# Patient Record
Sex: Male | Born: 1975
Health system: Southern US, Community
[De-identification: ages and names within clinical notes are randomized; demographics above are authoritative.]

## PROBLEM LIST (undated history)

## (undated) DIAGNOSIS — F410 Panic disorder [episodic paroxysmal anxiety] without agoraphobia: Secondary | ICD-10-CM

## (undated) DIAGNOSIS — M25519 Pain in unspecified shoulder: Secondary | ICD-10-CM

## (undated) DIAGNOSIS — F909 Attention-deficit hyperactivity disorder, unspecified type: Secondary | ICD-10-CM

## (undated) DIAGNOSIS — J449 Chronic obstructive pulmonary disease, unspecified: Secondary | ICD-10-CM

## (undated) DIAGNOSIS — F17200 Nicotine dependence, unspecified, uncomplicated: Secondary | ICD-10-CM

## (undated) DIAGNOSIS — F1120 Opioid dependence, uncomplicated: Secondary | ICD-10-CM

## (undated) DIAGNOSIS — F419 Anxiety disorder, unspecified: Secondary | ICD-10-CM

## (undated) DIAGNOSIS — G8929 Other chronic pain: Secondary | ICD-10-CM

## (undated) HISTORY — PX: FEMUR IM ROD REMOVAL: SHX1595

## (undated) HISTORY — DX: Panic disorder (episodic paroxysmal anxiety): F41.0

## (undated) HISTORY — PX: FEMUR FRACTURE SURGERY: SHX633

## (undated) HISTORY — DX: Attention-deficit hyperactivity disorder, unspecified type: F90.9

---

## 2015-12-17 ENCOUNTER — Emergency Department (HOSPITAL_BASED_OUTPATIENT_CLINIC_OR_DEPARTMENT_OTHER): Payer: Self-pay

## 2015-12-17 ENCOUNTER — Encounter (HOSPITAL_BASED_OUTPATIENT_CLINIC_OR_DEPARTMENT_OTHER): Payer: Self-pay | Admitting: *Deleted

## 2015-12-17 ENCOUNTER — Emergency Department (HOSPITAL_BASED_OUTPATIENT_CLINIC_OR_DEPARTMENT_OTHER)
Admission: EM | Admit: 2015-12-17 | Discharge: 2015-12-17 | Disposition: A | Discharge status: Home / Self Care | Payer: Self-pay | Attending: Emergency Medicine | Admitting: Emergency Medicine

## 2015-12-17 DIAGNOSIS — H9209 Otalgia, unspecified ear: Secondary | ICD-10-CM | POA: Insufficient documentation

## 2015-12-17 DIAGNOSIS — Z79899 Other long term (current) drug therapy: Secondary | ICD-10-CM | POA: Insufficient documentation

## 2015-12-17 DIAGNOSIS — J441 Chronic obstructive pulmonary disease with (acute) exacerbation: Secondary | ICD-10-CM | POA: Insufficient documentation

## 2015-12-17 DIAGNOSIS — Z7951 Long term (current) use of inhaled steroids: Secondary | ICD-10-CM | POA: Insufficient documentation

## 2015-12-17 DIAGNOSIS — R51 Headache: Secondary | ICD-10-CM | POA: Insufficient documentation

## 2015-12-17 DIAGNOSIS — R0981 Nasal congestion: Secondary | ICD-10-CM | POA: Insufficient documentation

## 2015-12-17 DIAGNOSIS — F17200 Nicotine dependence, unspecified, uncomplicated: Secondary | ICD-10-CM | POA: Insufficient documentation

## 2015-12-17 HISTORY — DX: Nicotine dependence, unspecified, uncomplicated: F17.200

## 2015-12-17 HISTORY — DX: Other chronic pain: G89.29

## 2015-12-17 HISTORY — DX: Anxiety disorder, unspecified: F41.9

## 2015-12-17 HISTORY — DX: Pain in unspecified shoulder: M25.519

## 2015-12-17 HISTORY — DX: Chronic obstructive pulmonary disease, unspecified: J44.9

## 2015-12-17 LAB — D-DIMER, QUANTITATIVE (NOT AT ARMC)

## 2015-12-17 LAB — CBC WITH DIFFERENTIAL/PLATELET
BASOS ABS: 0 10*3/uL (ref 0.0–0.1)
BASOS PCT: 0 %
EOS ABS: 0.1 10*3/uL (ref 0.0–0.7)
Eosinophils Relative: 2 %
HEMATOCRIT: 44.4 % (ref 39.0–52.0)
HEMOGLOBIN: 15.5 g/dL (ref 13.0–17.0)
Lymphocytes Relative: 29 %
Lymphs Abs: 1.9 10*3/uL (ref 0.7–4.0)
MCH: 31.8 pg (ref 26.0–34.0)
MCHC: 34.9 g/dL (ref 30.0–36.0)
MCV: 91 fL (ref 78.0–100.0)
Monocytes Absolute: 0.4 10*3/uL (ref 0.1–1.0)
Monocytes Relative: 6 %
NEUTROS ABS: 4.1 10*3/uL (ref 1.7–7.7)
NEUTROS PCT: 63 %
Platelets: 163 10*3/uL (ref 150–400)
RBC: 4.88 MIL/uL (ref 4.22–5.81)
RDW: 13 % (ref 11.5–15.5)
WBC: 6.6 10*3/uL (ref 4.0–10.5)

## 2015-12-17 LAB — BASIC METABOLIC PANEL
ANION GAP: 10 (ref 5–15)
BUN: 15 mg/dL (ref 6–20)
CALCIUM: 9.8 mg/dL (ref 8.9–10.3)
CHLORIDE: 102 mmol/L (ref 101–111)
CO2: 25 mmol/L (ref 22–32)
CREATININE: 0.71 mg/dL (ref 0.61–1.24)
GFR calc non Af Amer: >60 mL/min (ref >60)
Glucose, Bld: 101 mg/dL — ABNORMAL HIGH (ref 65–99)
Potassium: 4.5 mmol/L (ref 3.5–5.1)
SODIUM: 137 mmol/L (ref 135–145)

## 2015-12-17 LAB — TROPONIN I

## 2015-12-17 MED ORDER — DIPHENHYDRAMINE HCL 50 MG/ML IJ SOLN
25.0000 mg | Freq: Once | INTRAMUSCULAR | Status: AC
  Administered 2015-12-17: 25 mg via INTRAVENOUS
  Filled 2015-12-17: qty 1

## 2015-12-17 MED ORDER — METHYLPREDNISOLONE SODIUM SUCC 125 MG IJ SOLR
125.0000 mg | Freq: Once | INTRAMUSCULAR | Status: AC
  Administered 2015-12-17: 125 mg via INTRAVENOUS
  Filled 2015-12-17: qty 2

## 2015-12-17 MED ORDER — PREDNISONE 20 MG PO TABS: 60.0000 mg | ORAL_TABLET | Freq: Every day | ORAL | 0 refills | Status: AC

## 2015-12-17 MED ORDER — METOCLOPRAMIDE HCL 5 MG/ML IJ SOLN
10.0000 mg | Freq: Once | INTRAMUSCULAR | Status: AC
  Administered 2015-12-17: 10 mg via INTRAVENOUS
  Filled 2015-12-17: qty 2

## 2015-12-17 MED ORDER — KETOROLAC TROMETHAMINE 15 MG/ML IJ SOLN
15.0000 mg | Freq: Once | INTRAMUSCULAR | Status: AC
  Administered 2015-12-17: 15 mg via INTRAVENOUS
  Filled 2015-12-17: qty 1

## 2015-12-17 MED ORDER — ALBUTEROL SULFATE HFA 108 (90 BASE) MCG/ACT IN AERS: 1.0000 | INHALATION_SPRAY | RESPIRATORY_TRACT | 0 refills | Status: DC | PRN

## 2015-12-17 MED ORDER — DOXYCYCLINE HYCLATE 100 MG PO CAPS: 100.0000 mg | ORAL_CAPSULE | Freq: Two times a day (BID) | ORAL | 0 refills | Status: AC

## 2015-12-17 MED ORDER — NAPROXEN 375 MG PO TABS: 375.0000 mg | ORAL_TABLET | Freq: Two times a day (BID) | ORAL | 0 refills | Status: AC | PRN

## 2015-12-17 MED ORDER — ALBUTEROL SULFATE HFA 108 (90 BASE) MCG/ACT IN AERS
2.0000 | INHALATION_SPRAY | Freq: Once | RESPIRATORY_TRACT | Status: AC
  Administered 2015-12-17: 2 via RESPIRATORY_TRACT
  Filled 2015-12-17: qty 6.7

## 2015-12-17 NOTE — ED Triage Notes (Signed)
Pt c/o chest pain and congestion x 1 week with hx of copd

## 2015-12-17 NOTE — ED Provider Notes (Signed)
[facility]   CSN: [id number] Arrival date & time: 12/17/15  1429   By signing my name below, I, Teofilo PodMatthew P. Jamison, attest that this documentation has been prepared under the direction and in the presence of Shaune Pollackameron Kelsye Loomer, MD . Electronically Signed: Teofilo PodMatthew P. Jamison, ED Scribe. 12/17/2015. 3:37 PM.   History   Chief Complaint Chief Complaint  Patient presents with  . COPD    The history is provided by the patient. No language interpreter was used.   HPI Comments:  Dustin Steele is a 40 y.o. male with PMHx of COPD who presents to the Emergency Department complaining of constant chest pain x 1 week. Pt states that 1 week ago he [facility] and another shopper was spraying cans of spraypaint inside the store next to him and immediately began having symtpoms. Pt complains of associated congestion, headache, ear pain, and productive cough. Pt rates the chest pain at 5 or 6/10, and describes the pain as "constant discomfort" and "a dull stabbing." Pt states that the chest pain is exacerbated when taking deep breaths. Pt notes that the chest pain radiates back and forth across his chest. Pt is a smoker. Pt has taken cefuroxime with no relief. Pt denies fever, nausea, vomiting, rash.  Past Medical History:  Diagnosis Date  . Anxiety   . 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 COPD (chronic obstructive pulmonary disease) (HCC)   . Nicotine dependence     There are no active problems to display for this patient.   Past Surgical History:  Procedure Laterality Date  . FEMUR FRACTURE SURGERY    . FEMUR IM ROD REMOVAL         Home Medications    Prior to Admission medications   Medication Sig Start Date End Date Taking? Authorizing Provider  albuterol (PROVENTIL HFA;VENTOLIN HFA) 108 (90 Base) MCG/ACT inhaler Inhale into the lungs every 6 (six) hours as needed for wheezing or shortness of breath.   Yes Historical Provider, MD  ALPRAZolam  Prudy Feeler(XANAX) 0.25 MG tablet Take 0.25 mg by mouth at bedtime as needed for anxiety.   Yes Historical Provider, MD  fluticasone (FLONASE) 50 MCG/ACT nasal spray Place 2 sprays into both nostrils daily.   Yes Historical Provider, MD  Fluticasone-Salmeterol (ADVAIR) 250-50 MCG/DOSE AEPB Inhale 1 puff into the lungs 2 (two) times daily.   Yes Historical Provider, MD  tiotropium (SPIRIVA) 18 MCG inhalation capsule Place 18 mcg into inhaler and inhale daily.   Yes Historical Provider, MD  albuterol (PROVENTIL HFA;VENTOLIN HFA) 108 (90 Base) MCG/ACT inhaler Inhale 1-2 puffs into the lungs every 4 (four) hours as needed for wheezing or shortness of breath. 12/17/15   Shaune Pollackameron Enos Muhl, MD  doxycycline (VIBRAMYCIN) 100 MG capsule Take 1 capsule (100 mg total) by mouth 2 (two) times daily. 12/17/15 12/27/15  Shaune Pollackameron Harly Pipkins, MD  naproxen (NAPROSYN) 375 MG tablet Take 1 tablet (375 mg total) by mouth 2 (two) times daily as needed for moderate pain. 12/17/15 12/24/15  Shaune Pollackameron Worley Radermacher, MD  predniSONE (DELTASONE) 20 MG tablet Take 3 tablets (60 mg total) by mouth daily. 12/17/15 12/22/15  Shaune Pollackameron Renna Kilmer, MD    Family History History reviewed. No pertinent family history.  Social History Social History  Substance Use Topics  . Smoking status: Current Every Day Smoker    Packs/day: 1.00  . Smokeless tobacco: Not on file  . Alcohol use 1.2 oz/week    2 Cans of beer per week  Allergies   Review of patient's allergies indicates no known allergies.   Review of Systems Review of Systems  Constitutional: Negative for chills, fatigue and fever.  HENT: Positive for congestion and ear pain. Negative for rhinorrhea.   Eyes: Negative for visual disturbance.  Respiratory: Positive for cough. Negative for shortness of breath and wheezing.   Cardiovascular: Positive for chest pain. Negative for leg swelling.  Gastrointestinal: Negative for abdominal pain, diarrhea, nausea and vomiting.  Genitourinary: Negative for  dysuria and flank pain.  Musculoskeletal: Negative for neck pain and neck stiffness.  Skin: Negative for rash and wound.  Allergic/Immunologic: Negative for immunocompromised state.  Neurological: Positive for headaches. Negative for syncope and weakness.  All other systems reviewed and are negative.    Physical Exam Updated Vital Signs BP 115/75   Pulse (!) 55   Temp 98.1 F (36.7 C)   Resp 17   Ht 5\' 5"  (1.651 m)   Wt 135 lb (61.2 kg)   SpO2 98%   BMI 22.47 kg/m   Physical Exam  Constitutional: He is oriented to person, place, and time. He appears well-developed and well-nourished. No distress.  HENT:  Head: Normocephalic and atraumatic.  Eyes: Conjunctivae are normal.  Neck: Neck supple.  Cardiovascular: Normal rate, regular rhythm and normal heart sounds.  Exam reveals no friction rub.   No murmur heard. Pulmonary/Chest: Effort normal. No respiratory distress. He has wheezes (Mild, end expiratory only). He has no rales. He exhibits tenderness (Mild tenderness to palpation over anterior chest wall intercostal spaces. No bruising or deformity.).  Abdominal: He exhibits no distension.  Musculoskeletal: He exhibits no edema.  No calf pain, tenderness, or swelling  Neurological: He is alert and oriented to person, place, and time. He has normal strength. He is not disoriented. No cranial nerve deficit or sensory deficit. He exhibits normal muscle tone. GCS eye subscore is 4. GCS verbal subscore is 5. GCS motor subscore is 6.  Skin: Skin is warm. Capillary refill takes less than 2 seconds.  Psychiatric: He has a normal mood and affect.  Nursing note and vitals reviewed.    ED Treatments / Results  DIAGNOSTIC STUDIES:  Oxygen Saturation is 100% on RA, normal by my interpretation.    COORDINATION OF CARE:  3:37 PM Discussed treatment plan with pt at bedside and pt agreed to plan.   Labs (all labs ordered are listed, but only abnormal results are displayed) Labs  Reviewed  BASIC METABOLIC PANEL - Abnormal; Notable for the following:       Result Value   Glucose, Bld 101 (*)    All other components within normal limits  CBC WITH DIFFERENTIAL/PLATELET  D-DIMER, QUANTITATIVE (NOT AT [facility])  TROPONIN I    EKG  EKG Interpretation  Date/Time:  Sunday December 17 2015 14:39:36 EDT Ventricular Rate:  71 PR Interval:  140 QRS Duration: 102 QT Interval:  394 QTC Calculation: 428 R Axis:   86 Text Interpretation:  Normal sinus rhythm No old tracing to compare Confirmed by Rayman Petrosian MD, Semone Orlov [phone]([id number]) on 12/17/2015 3:27:03 PM       Radiology Dg Chest 2 View  Result Date: 12/17/2015 CLINICAL DATA:  Bilateral chest wall pain for the past week at care in handling pain being sprayed. History of COPD, chronic bronchitis and emphysema. EXAM: CHEST  2 VIEW COMPARISON:  None. FINDINGS: Normal sized heart. Clear lungs. The lungs are hyperexpanded. Unremarkable bones. IMPRESSION: No acute abnormality.  COPD. Electronically Signed   By: Beckie SaltsSteven  Reid  M.D.   On: 12/17/2015 15:23    Procedures Procedures (including critical care time)  Medications Ordered in ED Medications  methylPREDNISolone sodium succinate (SOLU-MEDROL) 125 mg/2 mL injection 125 mg (125 mg Intravenous Given 12/17/15 1611)  albuterol (PROVENTIL HFA;VENTOLIN HFA) 108 (90 Base) MCG/ACT inhaler 2 puff (2 puffs Inhalation Given 12/17/15 1549)  metoCLOPramide (REGLAN) injection 10 mg (10 mg Intravenous Given 12/17/15 1613)  diphenhydrAMINE (BENADRYL) injection 25 mg (25 mg Intravenous Given 12/17/15 1611)  ketorolac (TORADOL) 15 MG/ML injection 15 mg (15 mg Intravenous Given 12/17/15 1710)     Initial Impression / Assessment and Plan / ED Course  I have reviewed the triage vital signs and the nursing notes.  Pertinent labs & imaging results that were available during my care of the patient were reviewed by me and considered in my medical decision making (see chart for details).  Clinical  Course    40 year old male with past medical history of COPD here with cough, sputum production, and shortness of breath as well as positional, reproducible chest pain in the setting of known pain exposure, which has triggered his COPD in the past. On arrival, vital signs are stable. He is satting well on room air. No tachycardia. My primary suspicion is reactive airway COPD exacerbation in the setting of known trigger. Patient also may have URI versus reactive sinusitis in the setting of allergen exposure. Will treat with steroids, breathing treatment, and reassess. Otherwise, patient has multiple chronic complaints. Regarding his chest pain, his EKG is nonischemic. It is sharp, positional, likely secondary to coughing. However, given pleuritic component, will obtain d-dimer. Patient is low to moderate risk well's score and I believe this is sufficient for evaluation if negative. Otherwise, doubt ACS but will check screening troponin. Single troponin will be sufficient setting of constant, unchanging pain 24 hours. Regarding his headache, he has chronic migraines. Her headache is similar to his usual migraine. No focal numbness, weakness, or neurological deficits to suggest CVA or intracranial lesion. Will give migraine cocktail.  Chest x-ray is clear without pneumonia. CBC shows no leukocytosis. BMP is unremarkable. Troponin is negative, which is reassuring in setting of constant, unchanged chest pain. Suspect musculoskeletal chest wall pain. D-dimer is pending. Symptoms improved with breathing treatment and migraine cocktail.  D-dimer is negative. Headache is resolved. Suspect musculoskeletal chest pain in the setting of COPD exacerbation. Will treat with prednisone, doxycycline, and outpatient follow-up.   Final Clinical Impressions(s) / ED Diagnoses   Final diagnoses:  COPD exacerbation (HCC)    New Prescriptions Discharge Medication List as of 12/17/2015  5:26 PM    START taking these  medications   Details  !! albuterol (PROVENTIL HFA;VENTOLIN HFA) 108 (90 Base) MCG/ACT inhaler Inhale 1-2 puffs into the lungs every 4 (four) hours as needed for wheezing or shortness of breath., Starting Sun 12/17/2015, Print    doxycycline (VIBRAMYCIN) 100 MG capsule Take 1 capsule (100 mg total) by mouth 2 (two) times daily., Starting Sun 12/17/2015, Until Wed 12/27/2015, Print    predniSONE (DELTASONE) 20 MG tablet Take 3 tablets (60 mg total) by mouth daily., Starting Sun 12/17/2015, Until Fri 12/22/2015, Print     !! - Potential duplicate medications found. Please discuss with provider.      I personally performed the services described in this documentation, which was scribed in my presence. The recorded information has been reviewed and is accurate.     Shaune Pollack, MD 12/18/15 [phone]

## 2016-02-20 ENCOUNTER — Emergency Department (HOSPITAL_BASED_OUTPATIENT_CLINIC_OR_DEPARTMENT_OTHER): Payer: Self-pay

## 2016-02-20 ENCOUNTER — Emergency Department (HOSPITAL_BASED_OUTPATIENT_CLINIC_OR_DEPARTMENT_OTHER)
Admission: EM | Admit: 2016-02-20 | Discharge: 2016-02-20 | Disposition: A | Discharge status: Home / Self Care | Payer: Self-pay | Attending: Physician Assistant | Admitting: Physician Assistant

## 2016-02-20 ENCOUNTER — Encounter (HOSPITAL_BASED_OUTPATIENT_CLINIC_OR_DEPARTMENT_OTHER): Payer: Self-pay | Admitting: *Deleted

## 2016-02-20 DIAGNOSIS — F17200 Nicotine dependence, unspecified, uncomplicated: Secondary | ICD-10-CM | POA: Insufficient documentation

## 2016-02-20 DIAGNOSIS — J42 Unspecified chronic bronchitis: Secondary | ICD-10-CM | POA: Insufficient documentation

## 2016-02-20 DIAGNOSIS — Z79899 Other long term (current) drug therapy: Secondary | ICD-10-CM | POA: Insufficient documentation

## 2016-02-20 DIAGNOSIS — R0602 Shortness of breath: Secondary | ICD-10-CM | POA: Insufficient documentation

## 2016-02-20 LAB — CBC WITH DIFFERENTIAL/PLATELET
Basophils Absolute: 0 10*3/uL (ref 0.0–0.1)
Basophils Relative: 1 %
Eosinophils Absolute: 0.1 10*3/uL (ref 0.0–0.7)
Eosinophils Relative: 1 %
HEMATOCRIT: 40.8 % (ref 39.0–52.0)
Hemoglobin: 14 g/dL (ref 13.0–17.0)
LYMPHS ABS: 2.1 10*3/uL (ref 0.7–4.0)
LYMPHS PCT: 37 %
MCH: 32.3 pg (ref 26.0–34.0)
MCHC: 34.3 g/dL (ref 30.0–36.0)
MCV: 94 fL (ref 78.0–100.0)
MONO ABS: 0.4 10*3/uL (ref 0.1–1.0)
MONOS PCT: 7 %
NEUTROS ABS: 3.1 10*3/uL (ref 1.7–7.7)
Neutrophils Relative %: 54 %
Platelets: 187 10*3/uL (ref 150–400)
RBC: 4.34 MIL/uL (ref 4.22–5.81)
RDW: 13.1 % (ref 11.5–15.5)
WBC: 5.7 10*3/uL (ref 4.0–10.5)

## 2016-02-20 LAB — D-DIMER, QUANTITATIVE (NOT AT ARMC)

## 2016-02-20 LAB — BRAIN NATRIURETIC PEPTIDE: B Natriuretic Peptide: 45.3 pg/mL (ref 0.0–100.0)

## 2016-02-20 LAB — TROPONIN I: Troponin I: <0.03 ng/mL (ref <0.03)

## 2016-02-20 MED ORDER — BENZONATATE 100 MG PO CAPS: 100.0000 mg | ORAL_CAPSULE | Freq: Three times a day (TID) | ORAL | 0 refills | Status: DC

## 2016-02-20 MED ORDER — IPRATROPIUM-ALBUTEROL 0.5-2.5 (3) MG/3ML IN SOLN
3.0000 mL | Freq: Once | RESPIRATORY_TRACT | Status: AC
  Administered 2016-02-20: 3 mL via RESPIRATORY_TRACT
  Filled 2016-02-20: qty 3

## 2016-02-20 MED ORDER — PREDNISONE 50 MG PO TABS
60.0000 mg | ORAL_TABLET | Freq: Once | ORAL | Status: AC
  Administered 2016-02-20: 60 mg via ORAL
  Filled 2016-02-20: qty 1

## 2016-02-20 MED ORDER — AZITHROMYCIN 250 MG PO TABS: 500.0000 mg | ORAL_TABLET | Freq: Every day | ORAL | 0 refills | Status: DC

## 2016-02-20 MED ORDER — BENZONATATE 100 MG PO CAPS
100.0000 mg | ORAL_CAPSULE | Freq: Once | ORAL | Status: AC
  Administered 2016-02-20: 100 mg via ORAL
  Filled 2016-02-20: qty 1

## 2016-02-20 MED ORDER — PREDNISONE 20 MG PO TABS: 40.0000 mg | ORAL_TABLET | Freq: Every day | ORAL | 0 refills | Status: DC

## 2016-02-20 NOTE — Discharge Instructions (Signed)
Take the tessalon for cough. Take the prednisone for 3 days. Take the antibiotic once a day for 3 days. Use your albuterol inhaler as needed. Return to the ED if your symptoms worsen. Follow up with a primary care doctor as needed. Take tylenol and motrin as needed for pain.

## 2016-02-20 NOTE — ED Provider Notes (Signed)
[facility]   CSN: [id number] Arrival date & time: 02/20/16  0865  By signing my name below, I, Dustin Steele, attest that this documentation has been prepared under the direction and in the presence of Demetrios Loll, PA-C. Electronically Signed: Linna Steele, Scribe. 02/20/2016. 7:53 PM.  History   Chief Complaint Chief Complaint  Patient presents with  . Cough    The history is provided by the patient. No language interpreter was used.     HPI Comments: Dustin Steele is a 41 y.o. male with PMHx significant for COPD and chronic bronchitis who presents to the Emergency Department complaining of a persistent dry cough for about 3 days. He reports associated burning chest pain secondary to his cough as well as sinus pressure, congestion, and subjective fever/chills. He has tried Sudafed and TheraFlu with no relief of his symptoms. He also has an inhaler at home which he has used with some transient improvement of his cough. He notes he has experienced the same symptoms several times in the past and describes these episodes as exacerbations of his COPD and chronic bronchitis. He was seen here ~ 3 months ago for the same and was prescribed prednisone and doxycycline with good relief of his symptoms. He has no known sick contacts with similar symptoms. Pt is a regular smoker. He denies rhinorrhea, body aches, or any other associated symptoms.  Past Medical History:  Diagnosis Date  . Anxiety   . 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 COPD (chronic obstructive pulmonary disease) (HCC)   . Nicotine dependence     There are no active problems to display for this patient.   Past Surgical History:  Procedure Laterality Date  . FEMUR FRACTURE SURGERY    . FEMUR IM ROD REMOVAL         Home Medications    Prior to Admission medications   Medication Sig Start Date End Date Taking? Authorizing Provider  albuterol (PROVENTIL HFA;VENTOLIN HFA) 108 (90 Base) MCG/ACT  inhaler Inhale into the lungs every 6 (six) hours as needed for wheezing or shortness of breath.   Yes Historical Provider, MD  ALPRAZolam Prudy Feeler) 0.25 MG tablet Take 0.25 mg by mouth at bedtime as needed for anxiety.   Yes Historical Provider, MD  fluticasone (FLONASE) 50 MCG/ACT nasal spray Place 2 sprays into both nostrils daily.   Yes Historical Provider, MD  Fluticasone-Salmeterol (ADVAIR) 250-50 MCG/DOSE AEPB Inhale 1 puff into the lungs 2 (two) times daily.   Yes Historical Provider, MD  tiotropium (SPIRIVA) 18 MCG inhalation capsule Place 18 mcg into inhaler and inhale daily.   Yes Historical Provider, MD  albuterol (PROVENTIL HFA;VENTOLIN HFA) 108 (90 Base) MCG/ACT inhaler Inhale 1-2 puffs into the lungs every 4 (four) hours as needed for wheezing or shortness of breath. 12/17/15   Shaune Pollack, MD    Family History No family history on file.  Social History Social History  Substance Use Topics  . Smoking status: Current Every Day Smoker    Packs/day: 1.00  . Smokeless tobacco: Never Used  . Alcohol use 1.2 oz/week    2 Cans of beer per week     Allergies   Patient has no known allergies.   Review of Systems Review of Systems  Constitutional: Positive for chills (subjective) and fever (subjective).  HENT: Positive for congestion and sinus pressure. Negative for rhinorrhea.   Respiratory: Positive for cough.   Cardiovascular: Positive for chest pain (secondary to cough).  Musculoskeletal: Negative  for myalgias.  All other systems reviewed and are negative.    Physical Exam Updated Vital Signs BP 156/91 (BP Location: Left Arm)   Pulse 83   Temp 97.8 F (36.6 C) (Oral)   Resp 18   Ht 5\' 5"  (1.651 m)   Wt 142 lb (64.4 kg)   SpO2 100%   BMI 23.63 kg/m   Physical Exam  Constitutional: He is oriented to person, place, and time. He appears well-developed and well-nourished. No distress.  HENT:  Head: Normocephalic and atraumatic.  Right Ear: Tympanic membrane,  external ear and ear canal normal.  Left Ear: Tympanic membrane, external ear and ear canal normal.  Nose: Mucosal edema and rhinorrhea present. Right sinus exhibits no maxillary sinus tenderness and no frontal sinus tenderness. Left sinus exhibits no maxillary sinus tenderness and no frontal sinus tenderness.  Mouth/Throat: Mucous membranes are normal. Posterior oropharyngeal erythema present. No oropharyngeal exudate, posterior oropharyngeal edema or tonsillar abscesses. Tonsils are 1+ on the right. Tonsils are 1+ on the left. No tonsillar exudate.  Eyes: Conjunctivae and EOM are normal. Pupils are equal, round, and reactive to light.  Neck: Neck supple. No tracheal deviation present. Thyromegaly present.  Cardiovascular: Normal rate, regular rhythm, normal heart sounds and intact distal pulses.  Exam reveals no gallop and no friction rub.   No murmur heard. Pulmonary/Chest: Effort normal. No accessory muscle usage. No tachypnea. No respiratory distress. He has no decreased breath sounds. He has wheezes (throughout).  Abdominal: Soft. Bowel sounds are normal. He exhibits no distension. There is no tenderness. There is no rebound and no guarding.  Musculoskeletal: Normal range of motion.  Neurological: He is alert and oriented to person, place, and time.  Skin: Skin is warm and dry. Capillary refill takes less than 2 seconds.  Psychiatric: He has a normal mood and affect. His behavior is normal.  Nursing note and vitals reviewed.    ED Treatments / Results  Labs (all labs ordered are listed, but only abnormal results are displayed) Labs Reviewed  D-DIMER, QUANTITATIVE (NOT AT [facility])  TROPONIN I  CBC WITH DIFFERENTIAL/PLATELET  BRAIN NATRIURETIC PEPTIDE    EKG  EKG Interpretation  Date/Time:  Tuesday February 20 2016 20:36:40 EST Ventricular Rate:  93 PR Interval:  126 QRS Duration: 100 QT Interval:  372 QTC Calculation: 462 R Axis:   91 Text Interpretation:  Normal sinus rhythm  Rightward axis Left ventricular hypertrophy Abnormal ECG No significant change since last tracing Confirmed by Kandis Mannan ([id number]) on 02/20/2016 10:13:26 PM       Radiology Dg Chest 2 View  Result Date: 02/20/2016 CLINICAL DATA:  Cough and chest congestion. EXAM: CHEST  2 VIEW COMPARISON:  12/17/2015 FINDINGS: Normal heart size and mediastinal contours. Mild hyperinflation in this patient with history of COPD. No acute infiltrate or edema. No effusion or pneumothorax. No acute osseous findings. IMPRESSION: No acute finding.  Negative for pneumonia. Electronically Signed   By: Marnee Spring M.D.   On: 02/20/2016 16:36    Procedures Procedures (including critical care time)  DIAGNOSTIC STUDIES: Oxygen Saturation is 100% on RA, normal by my interpretation.    COORDINATION OF CARE: 7:59 PM Discussed treatment plan with pt at bedside and pt agreed to plan.  Medications Ordered in ED Medications  ipratropium-albuterol (DUONEB) 0.5-2.5 (3) MG/3ML nebulizer solution 3 mL (3 mLs Nebulization Given 02/20/16 2015)  predniSONE (DELTASONE) tablet 60 mg (60 mg Oral Given 02/20/16 2040)  benzonatate (TESSALON) capsule 100 mg (100 mg Oral Given  02/20/16 2040)     Initial Impression / Assessment and Plan / ED Course  I have reviewed the triage vital signs and the nursing notes.  Pertinent labs & imaging results that were available during my care of the patient were reviewed by me and considered in my medical decision making (see chart for details).  Clinical Course   Patient presents with productive cough, sob, chest pain. PMh includes COPD. Patient is afebrile, no hypoxia or tachypnea. Lab unremarkable. EKG with no acute changes. D dimer negative. Patient is low to moderate risk well's score. Negative troponin. Single troponin will be sufficient setting of constant, unchanging pain 24 hours. CXR shows no signs of infiltrate. CBC shows no leukocytosis. BMP normal. Pt given steroids, duoneb, and  cough medicine. Sig improvement in symptoms. Likely COPD exacerbation in setting of URI. Given steroids, albuterol, tessalon and azithromycin. Pt is hemodynamically stable and in NAD. Pt is not hypoxic or tacnhpenic. Pt is hemodynamically stable, in NAD, & able to ambulate in the ED. Pain has been managed & has no complaints prior to dc. Pt is comfortable with above plan and is stable for discharge at this time. All questions were answered prior to disposition. Strict return precautions for f/u to the ED were discussed.       Final Clinical Impressions(s) / ED Diagnoses   Final diagnoses:  Chronic bronchitis, unspecified chronic bronchitis type [facility](HCC)    New Prescriptions Discharge Medication List as of 02/20/2016 10:25 PM    START taking these medications   Details  azithromycin (ZITHROMAX) 250 MG tablet Take 2 tablets (500 mg total) by mouth daily. Take first 2 tablets together, then 1 every day until finished., Starting Tue 02/20/2016, Print    benzonatate (TESSALON) 100 MG capsule Take 1 capsule (100 mg total) by mouth every 8 (eight) hours., Starting Tue 02/20/2016, Print    predniSONE (DELTASONE) 20 MG tablet Take 2 tablets (40 mg total) by mouth daily with breakfast., Starting Tue 02/20/2016, Print      I personally performed the services described in this documentation, which was scribed in my presence. The recorded information has been reviewed and is accurate.    Rise MuKenneth T Leaphart, PA-C 02/25/16 0139    Courteney Randall AnLyn Mackuen, MD 02/26/16 [id number]

## 2016-02-20 NOTE — ED Notes (Signed)
Pt. Asking about a work note.  Explained that the Dr. Erasmo Downeridn't write one.  Pt. Must be clear to go back to work.

## 2016-02-25 ENCOUNTER — Emergency Department (HOSPITAL_BASED_OUTPATIENT_CLINIC_OR_DEPARTMENT_OTHER): Payer: Self-pay

## 2016-02-25 ENCOUNTER — Encounter (HOSPITAL_BASED_OUTPATIENT_CLINIC_OR_DEPARTMENT_OTHER): Payer: Self-pay | Admitting: *Deleted

## 2016-02-25 ENCOUNTER — Emergency Department (HOSPITAL_BASED_OUTPATIENT_CLINIC_OR_DEPARTMENT_OTHER)
Admission: EM | Admit: 2016-02-25 | Discharge: 2016-02-25 | Disposition: A | Discharge status: Home / Self Care | Payer: Self-pay | Attending: Emergency Medicine | Admitting: Emergency Medicine

## 2016-02-25 DIAGNOSIS — F17200 Nicotine dependence, unspecified, uncomplicated: Secondary | ICD-10-CM | POA: Insufficient documentation

## 2016-02-25 DIAGNOSIS — J441 Chronic obstructive pulmonary disease with (acute) exacerbation: Secondary | ICD-10-CM | POA: Insufficient documentation

## 2016-02-25 DIAGNOSIS — Z79899 Other long term (current) drug therapy: Secondary | ICD-10-CM | POA: Insufficient documentation

## 2016-02-25 DIAGNOSIS — J069 Acute upper respiratory infection, unspecified: Secondary | ICD-10-CM | POA: Insufficient documentation

## 2016-02-25 DIAGNOSIS — B9789 Other viral agents as the cause of diseases classified elsewhere: Secondary | ICD-10-CM

## 2016-02-25 MED ORDER — IPRATROPIUM BROMIDE 0.02 % IN SOLN
0.5000 mg | Freq: Once | RESPIRATORY_TRACT | Status: AC
  Administered 2016-02-25: 0.5 mg via RESPIRATORY_TRACT
  Filled 2016-02-25: qty 2.5

## 2016-02-25 MED ORDER — ALBUTEROL SULFATE (2.5 MG/3ML) 0.083% IN NEBU
5.0000 mg | INHALATION_SOLUTION | Freq: Once | RESPIRATORY_TRACT | Status: AC
  Administered 2016-02-25: 5 mg via RESPIRATORY_TRACT
  Filled 2016-02-25: qty 6

## 2016-02-25 MED ORDER — PREDNISONE 20 MG PO TABS: ORAL_TABLET | ORAL | 0 refills | Status: DC

## 2016-02-25 MED ORDER — BENZONATATE 100 MG PO CAPS
100.0000 mg | ORAL_CAPSULE | Freq: Once | ORAL | Status: AC
  Administered 2016-02-25: 100 mg via ORAL
  Filled 2016-02-25: qty 1

## 2016-02-25 MED ORDER — BENZONATATE 100 MG PO CAPS: 100.0000 mg | ORAL_CAPSULE | Freq: Three times a day (TID) | ORAL | 0 refills | Status: DC

## 2016-02-25 NOTE — ED Notes (Signed)
Patient transported to X-ray 

## 2016-02-25 NOTE — ED Provider Notes (Signed)
[facility] Provider Note   CSN: [id number] Arrival date & time: 02/25/16  1048     History   Chief Complaint Chief Complaint  Patient presents with  . Cough    HPI Dustin Steele is a 41 y.o. male.  HPI   41 year old male with history of anxiety, COPD, chronic shoulder pain and nicotine dependence presenting complaining of cold symptoms. Patient was seen 5 days ago for evaluation of cold symptoms. He was discharged with a course of steroid, Z-Pak and symptomatic treatment. He reported his by using the medication he continues to have shortness of breath, nonproductive cough, occasional wheezing. He denies any fever, chills, hemoptysis, or rash. Patient states in the past when he has failed like this, he usually gets a longer duration of steroid course as well as doxycycline. He denies any recent travel, prior history of PE or DVT, calf pain or leg swelling. He smoker. No nausea vomiting diarrhea.      Past Medical History:  Diagnosis Date  . Anxiety   . 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 COPD (chronic obstructive pulmonary disease) (HCC)   . Nicotine dependence     There are no active problems to display for this patient.   Past Surgical History:  Procedure Laterality Date  . FEMUR FRACTURE SURGERY    . FEMUR IM ROD REMOVAL         Home Medications    Prior to Admission medications   Medication Sig Start Date End Date Taking? Authorizing Provider  albuterol (PROVENTIL HFA;VENTOLIN HFA) 108 (90 Base) MCG/ACT inhaler Inhale 1-2 puffs into the lungs every 4 (four) hours as needed for wheezing or shortness of breath. 12/17/15  Yes Shaune Pollack, MD  ALPRAZolam Prudy Feeler) 0.25 MG tablet Take 0.25 mg by mouth at bedtime as needed for anxiety.   Yes Historical Provider, MD  fluticasone (FLONASE) 50 MCG/ACT nasal spray Place 2 sprays into both nostrils daily.   Yes Historical Provider, MD  Fluticasone-Salmeterol (ADVAIR) 250-50 MCG/DOSE AEPB Inhale 1 puff into the lungs  2 (two) times daily.   Yes Historical Provider, MD  albuterol (PROVENTIL HFA;VENTOLIN HFA) 108 (90 Base) MCG/ACT inhaler Inhale into the lungs every 6 (six) hours as needed for wheezing or shortness of breath.    Historical Provider, MD  azithromycin (ZITHROMAX) 250 MG tablet Take 2 tablets (500 mg total) by mouth daily. Take first 2 tablets together, then 1 every day until finished. 02/20/16   Rise Mu, PA-C  benzonatate (TESSALON) 100 MG capsule Take 1 capsule (100 mg total) by mouth every 8 (eight) hours. 02/20/16   Rise Mu, PA-C  predniSONE (DELTASONE) 20 MG tablet Take 2 tablets (40 mg total) by mouth daily with breakfast. 02/20/16   Rise Mu, PA-C  tiotropium (SPIRIVA) 18 MCG inhalation capsule Place 18 mcg into inhaler and inhale daily.    Historical Provider, MD    Family History No family history on file.  Social History Social History  Substance Use Topics  . Smoking status: Current Every Day Smoker    Packs/day: 1.00  . Smokeless tobacco: Never Used  . Alcohol use 1.2 oz/week    2 Cans of beer per week     Allergies   Patient has no known allergies.   Review of Systems Review of Systems  All other systems reviewed and are negative.    Physical Exam Updated Vital Signs BP 149/83 (BP Location: Right Arm)   Pulse 84   Temp 98.2  F (36.8 C) (Oral)   Resp 18   Ht 5\' 5"  (1.651 m)   Wt 64.4 kg   SpO2 98%   BMI 23.63 kg/m   Physical Exam  Constitutional: He appears well-developed and well-nourished. No distress.  HENT:  Head: Atraumatic.  Right Ear: External ear normal.  Left Ear: External ear normal.  Nose: Nose normal.  Mouth/Throat: Oropharynx is clear and moist.  Eyes: Conjunctivae are normal.  Neck: Normal range of motion. Neck supple.  Cardiovascular: Normal rate and regular rhythm.   Pulmonary/Chest: Effort normal. He has wheezes (Faint expiratory wheezes).  Abdominal: Soft. There is no tenderness.  Musculoskeletal: Normal  range of motion. He exhibits no edema.  Neurological: He is alert.  Skin: No rash noted.  Psychiatric: He has a normal mood and affect.  Nursing note and vitals reviewed.    ED Treatments / Results  Labs (all labs ordered are listed, but only abnormal results are displayed) Labs Reviewed - No data to display  EKG  EKG Interpretation None       Radiology Dg Chest 2 View  Result Date: 02/25/2016 CLINICAL DATA:  Chest congestion and cough EXAM: CHEST  2 VIEW COMPARISON:  02/20/2016 FINDINGS: The heart size and mediastinal contours are within normal limits. Both lungs are clear. The visualized skeletal structures are unremarkable. Left nipple piercing again noted. IMPRESSION: No active cardiopulmonary disease. Electronically Signed   By: Judie PetitM.  Shick M.D.   On: 02/25/2016 11:48    Procedures Procedures (including critical care time)  Medications Ordered in ED Medications - No data to display   Initial Impression / Assessment and Plan / ED Course  I have reviewed the triage vital signs and the nursing notes.  Pertinent labs & imaging results that were available during my care of the patient were reviewed by me and considered in my medical decision making (see chart for details).  Clinical Course    BP 127/77 (BP Location: Right Arm)   Pulse 95   Temp 98.2 F (36.8 C) (Oral)   Resp 18   Ht 5\' 5"  (1.651 m)   Wt 64.4 kg   SpO2 100%   BMI 23.63 kg/m    Final Clinical Impressions(s) / ED Diagnoses   Final diagnoses:  Viral URI with cough  COPD exacerbation (HCC)    New Prescriptions New Prescriptions   BENZONATATE (TESSALON) 100 MG CAPSULE    Take 1 capsule (100 mg total) by mouth every 8 (eight) hours.   PREDNISONE (DELTASONE) 20 MG TABLET    3 tabs po day one, then 2 tabs daily x 4 days    1:19 PM Patient here with a prolonged course of viral URI worsening his COPD. He was treated recently with antibiotic and steroid but symptoms still persist. He is  well-appearing on exam, in no acute respiratory distress, faint expiratory wheezes only. Repeat chest x-ray without any signs of pneumonia. Plan to provide breathing treatment here, and will prescribe a short course of steroid. I do not think additional antibiotic is indicated at this time.   Fayrene HelperBowie Juriel Cid, PA-C 02/25/16 1349    Lyndal Pulleyaniel Knott, MD 02/25/16 [phone]

## 2016-02-25 NOTE — ED Notes (Signed)
Pt upset about having to wait so long for a breathing tx. I explained to him that the provider had not ordered one and that I would go ask him about it.

## 2016-02-25 NOTE — ED Notes (Signed)
Pt complained of hyperventilating during breathing tx. tx was stopped and pt was instructed to take so deep breaths, heart rate decreased. RN at bedside.

## 2016-02-25 NOTE — ED Triage Notes (Signed)
Patient states he was seen here 5 days ago for non productive cough, chest congestion and pain in the right chest with coughing.  States he was given steroids and antibiotics and feels like his symptoms are unchanged.  History of COPD and current smoker.

## 2016-02-25 NOTE — ED Notes (Signed)
Pulse pre walk 92, sats 100%, during and post walk sats remain 100% hr 95. Tolerated well.

## 2016-04-06 ENCOUNTER — Emergency Department (HOSPITAL_BASED_OUTPATIENT_CLINIC_OR_DEPARTMENT_OTHER)
Admission: EM | Admit: 2016-04-06 | Discharge: 2016-04-06 | Disposition: A | Discharge status: Home / Self Care | Payer: Self-pay | Attending: Physician Assistant | Admitting: Physician Assistant

## 2016-04-06 ENCOUNTER — Emergency Department (HOSPITAL_BASED_OUTPATIENT_CLINIC_OR_DEPARTMENT_OTHER): Payer: Self-pay

## 2016-04-06 DIAGNOSIS — Y999 Unspecified external cause status: Secondary | ICD-10-CM | POA: Insufficient documentation

## 2016-04-06 DIAGNOSIS — F17200 Nicotine dependence, unspecified, uncomplicated: Secondary | ICD-10-CM | POA: Insufficient documentation

## 2016-04-06 DIAGNOSIS — W228XXA Striking against or struck by other objects, initial encounter: Secondary | ICD-10-CM | POA: Insufficient documentation

## 2016-04-06 DIAGNOSIS — Y929 Unspecified place or not applicable: Secondary | ICD-10-CM | POA: Insufficient documentation

## 2016-04-06 DIAGNOSIS — Y939 Activity, unspecified: Secondary | ICD-10-CM | POA: Insufficient documentation

## 2016-04-06 DIAGNOSIS — Z79899 Other long term (current) drug therapy: Secondary | ICD-10-CM | POA: Insufficient documentation

## 2016-04-06 DIAGNOSIS — J449 Chronic obstructive pulmonary disease, unspecified: Secondary | ICD-10-CM | POA: Insufficient documentation

## 2016-04-06 DIAGNOSIS — R0781 Pleurodynia: Secondary | ICD-10-CM | POA: Insufficient documentation

## 2016-04-06 MED ORDER — OXYCODONE-ACETAMINOPHEN 5-325 MG PO TABS: 1.0000 | ORAL_TABLET | Freq: Four times a day (QID) | ORAL | 0 refills | Status: DC | PRN

## 2016-04-06 MED ORDER — OXYCODONE-ACETAMINOPHEN 5-325 MG PO TABS
1.0000 | ORAL_TABLET | Freq: Once | ORAL | Status: AC
  Administered 2016-04-06: 1 via ORAL
  Filled 2016-04-06: qty 1

## 2016-04-06 NOTE — ED Notes (Signed)
ED Provider at bedside. 

## 2016-04-06 NOTE — Discharge Instructions (Signed)
Please be wary of any signs of pneumonia and return. You'll need to return to her primary care office if you need any other pain medications.

## 2016-04-06 NOTE — ED Notes (Signed)
Patient transported to X-ray 

## 2016-04-06 NOTE — ED Provider Notes (Signed)
[facility]   CSN: [id number] Arrival date & time: 04/06/16  1902  By signing my name below, I, Dustin Steele, attest that this documentation has been prepared under the direction and in the presence of Dustin Kleckner Randall An, MD. Electronically Signed: Modena Steele, Scribe. 04/06/2016. 7:46 PM.  History   Chief Complaint Chief Complaint  Patient presents with  . Rib Injury   The history is provided by the patient. No language interpreter was used.   HPI Comments: Dustin Steele is a 41 y.o. male who presents to the Emergency Department complaining of constant moderate posterior left rib pain that started 2 days ago. He states he backed up into a trailer and hit his rib. He took tylenol and naproxen PTA without relief. His pain worsened and is exacerbated by deep breathing. He admits to a hx of fractured rib. He denies any other complaints.   Past Medical History:  Diagnosis Date  . Anxiety   . 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 COPD (chronic obstructive pulmonary disease) (HCC)   . Nicotine dependence     There are no active problems to display for this patient.   Past Surgical History:  Procedure Laterality Date  . FEMUR FRACTURE SURGERY    . FEMUR IM ROD REMOVAL         Home Medications    Prior to Admission medications   Medication Sig Start Date End Date Taking? Authorizing Provider  albuterol (PROVENTIL HFA;VENTOLIN HFA) 108 (90 Base) MCG/ACT inhaler Inhale into the lungs every 6 (six) hours as needed for wheezing or shortness of breath.    Historical Provider, MD  albuterol (PROVENTIL HFA;VENTOLIN HFA) 108 (90 Base) MCG/ACT inhaler Inhale 1-2 puffs into the lungs every 4 (four) hours as needed for wheezing or shortness of breath. 12/17/15   Shaune Pollack, MD  ALPRAZolam Prudy Feeler) 0.25 MG tablet Take 0.25 mg by mouth at bedtime as needed for anxiety.    Historical Provider, MD  fluticasone (FLONASE) 50 MCG/ACT nasal spray Place 2 sprays into both  nostrils daily.    Historical Provider, MD  Fluticasone-Salmeterol (ADVAIR) 250-50 MCG/DOSE AEPB Inhale 1 puff into the lungs 2 (two) times daily.    Historical Provider, MD  tiotropium (SPIRIVA) 18 MCG inhalation capsule Place 18 mcg into inhaler and inhale daily.    Historical Provider, MD    Family History No family history on file.  Social History Social History  Substance Use Topics  . Smoking status: Current Every Day Smoker    Packs/day: 1.00  . Smokeless tobacco: Never Used  . Alcohol use 1.2 oz/week    2 Cans of beer per week     Allergies   Patient has no known allergies.   Review of Systems Review of Systems  Musculoskeletal: Positive for myalgias (Posterior left rib).  All other systems reviewed and are negative.    Physical Exam Updated Vital Signs BP 135/98 (BP Location: Left Arm)   Pulse 84   Temp 98.5 F (36.9 C) (Oral)   Resp 18   Ht 5\' 5"  (1.651 m)   Wt 145 lb (65.8 kg)   SpO2 100%   BMI 24.13 kg/m   Physical Exam  Constitutional: He appears well-developed and well-nourished. No distress.  HENT:  Head: Normocephalic and atraumatic.  Eyes: Conjunctivae are normal.  Neck: Neck supple.  Cardiovascular: Normal rate.   Pulmonary/Chest: Effort normal.  Abdominal: Soft.  Musculoskeletal: Normal range of motion.  No abrasions or bruising to the  posterior left rib.   Neurological: He is alert.  Skin: Skin is warm and dry.  Psychiatric: He has a normal mood and affect.  Nursing note and vitals reviewed.    ED Treatments / Results  DIAGNOSTIC STUDIES: Oxygen Saturation is 100% on RA, normal by my interpretation.    COORDINATION OF CARE: 7:50 PM- Pt advised of plan for treatment and pt agrees.  Labs (all labs ordered are listed, but only abnormal results are displayed) Labs Reviewed - No data to display  EKG  EKG Interpretation None       Radiology Dg Ribs Unilateral W/chest Left  Result Date: 04/06/2016 CLINICAL DATA:  Back into  trailer, with left posterior rib pain. Initial encounter. EXAM: LEFT RIBS AND CHEST - 3+ VIEW COMPARISON:  Chest radiograph from 02/25/2016 FINDINGS: No displaced rib fractures are seen. The lungs are well-aerated and clear. There is no evidence of focal opacification, pleural effusion or pneumothorax. The cardiomediastinal silhouette is within normal limits. No acute osseous abnormalities are seen. A metallic piercing is noted at the left nipple. IMPRESSION: No displaced rib fracture seen. No acute cardiopulmonary process identified. Electronically Signed   By: Roanna RaiderJeffery  Steele M.D.   On: 04/06/2016 19:37    Procedures Procedures (including critical care time)  Medications Ordered in ED Medications - No data to display   Initial Impression / Assessment and Plan / ED Course  I have reviewed the triage vital signs and the nursing notes.  Pertinent labs & imaging results that were available during my care of the patient were reviewed by me and considered in my medical decision making (see chart for details).    I personally performed the services described in this documentation, which was scribed in my presence. The recorded information has been reviewed and is accurate.    Patient with posterior rib pain. No bruising or abrasions noted. X-ray negative. We'll have him take his Aleve at home as planned. We'll give him a couple stronger pain medications in order to encourage deep breaths. Return precautions for pneumonia given.   Final Clinical Impressions(s) / ED Diagnoses   Final diagnoses:  None    New Prescriptions New Prescriptions   No medications on file      Cyan Moultrie Randall AnLyn Skya Mccullum, MD 04/06/16 2001

## 2016-04-06 NOTE — ED Triage Notes (Addendum)
States that he backed into a trailer and feels like he broke his left posterior rib.

## 2016-07-21 ENCOUNTER — Other Ambulatory Visit: Payer: Self-pay

## 2016-07-21 ENCOUNTER — Encounter (HOSPITAL_BASED_OUTPATIENT_CLINIC_OR_DEPARTMENT_OTHER): Payer: Self-pay

## 2016-07-21 ENCOUNTER — Emergency Department (HOSPITAL_BASED_OUTPATIENT_CLINIC_OR_DEPARTMENT_OTHER)
Admission: EM | Admit: 2016-07-21 | Discharge: 2016-07-21 | Disposition: A | Discharge status: Home / Self Care | Payer: Commercial Managed Care - PPO | Attending: Emergency Medicine | Admitting: Emergency Medicine

## 2016-07-21 ENCOUNTER — Emergency Department (HOSPITAL_BASED_OUTPATIENT_CLINIC_OR_DEPARTMENT_OTHER): Payer: Commercial Managed Care - PPO

## 2016-07-21 DIAGNOSIS — J441 Chronic obstructive pulmonary disease with (acute) exacerbation: Secondary | ICD-10-CM | POA: Diagnosis not present

## 2016-07-21 DIAGNOSIS — Z79899 Other long term (current) drug therapy: Secondary | ICD-10-CM | POA: Insufficient documentation

## 2016-07-21 DIAGNOSIS — F17210 Nicotine dependence, cigarettes, uncomplicated: Secondary | ICD-10-CM | POA: Diagnosis not present

## 2016-07-21 DIAGNOSIS — R079 Chest pain, unspecified: Secondary | ICD-10-CM | POA: Diagnosis present

## 2016-07-21 LAB — COMPREHENSIVE METABOLIC PANEL
ALK PHOS: 45 U/L (ref 38–126)
ALT: 16 U/L — ABNORMAL LOW (ref 17–63)
ANION GAP: 10 (ref 5–15)
AST: 27 U/L (ref 15–41)
Albumin: 3.8 g/dL (ref 3.5–5.0)
BILIRUBIN TOTAL: 0.5 mg/dL (ref 0.3–1.2)
BUN: 14 mg/dL (ref 6–20)
CALCIUM: 9.5 mg/dL (ref 8.9–10.3)
CO2: 22 mmol/L (ref 22–32)
Chloride: 106 mmol/L (ref 101–111)
Creatinine, Ser: 0.77 mg/dL (ref 0.61–1.24)
Glucose, Bld: 131 mg/dL — ABNORMAL HIGH (ref 65–99)
Potassium: 3.7 mmol/L (ref 3.5–5.1)
Sodium: 138 mmol/L (ref 135–145)
TOTAL PROTEIN: 6.8 g/dL (ref 6.5–8.1)

## 2016-07-21 LAB — CBC WITH DIFFERENTIAL/PLATELET
Basophils Absolute: 0 10*3/uL (ref 0.0–0.1)
Basophils Relative: 0 %
Eosinophils Absolute: 0 10*3/uL (ref 0.0–0.7)
Eosinophils Relative: 0 %
HEMATOCRIT: 41.5 % (ref 39.0–52.0)
HEMOGLOBIN: 14.6 g/dL (ref 13.0–17.0)
LYMPHS ABS: 0.9 10*3/uL (ref 0.7–4.0)
Lymphocytes Relative: 10 %
MCH: 32 pg (ref 26.0–34.0)
MCHC: 35.2 g/dL (ref 30.0–36.0)
MCV: 91 fL (ref 78.0–100.0)
MONOS PCT: 1 %
Monocytes Absolute: 0.1 10*3/uL (ref 0.1–1.0)
NEUTROS PCT: 89 %
Neutro Abs: 7.8 10*3/uL — ABNORMAL HIGH (ref 1.7–7.7)
Platelets: 207 10*3/uL (ref 150–400)
RBC: 4.56 MIL/uL (ref 4.22–5.81)
RDW: 12.7 % (ref 11.5–15.5)
WBC: 8.8 10*3/uL (ref 4.0–10.5)

## 2016-07-21 LAB — TROPONIN I

## 2016-07-21 MED ORDER — FLUTICASONE-SALMETEROL 250-50 MCG/DOSE IN AEPB: 1.0000 | INHALATION_SPRAY | Freq: Two times a day (BID) | RESPIRATORY_TRACT | 0 refills | Status: DC

## 2016-07-21 MED ORDER — ALBUTEROL SULFATE (2.5 MG/3ML) 0.083% IN NEBU
5.0000 mg | INHALATION_SOLUTION | Freq: Once | RESPIRATORY_TRACT | Status: AC
  Administered 2016-07-21: 5 mg via RESPIRATORY_TRACT
  Filled 2016-07-21: qty 6

## 2016-07-21 MED ORDER — TIOTROPIUM BROMIDE MONOHYDRATE 18 MCG IN CAPS: 18.0000 ug | ORAL_CAPSULE | Freq: Every day | RESPIRATORY_TRACT | 0 refills | Status: DC

## 2016-07-21 MED ORDER — METHYLPREDNISOLONE SODIUM SUCC 125 MG IJ SOLR
125.0000 mg | Freq: Once | INTRAMUSCULAR | Status: AC
  Administered 2016-07-21: 125 mg via INTRAVENOUS
  Filled 2016-07-21: qty 2

## 2016-07-21 MED ORDER — IPRATROPIUM BROMIDE 0.02 % IN SOLN
0.5000 mg | Freq: Once | RESPIRATORY_TRACT | Status: AC
Start: 2016-07-21 — End: 2016-07-21
  Administered 2016-07-21: 0.5 mg via RESPIRATORY_TRACT
  Filled 2016-07-21: qty 2.5

## 2016-07-21 MED ORDER — HYDROCOD POLST-CPM POLST ER 10-8 MG/5ML PO SUER: 5.0000 mL | Freq: Two times a day (BID) | ORAL | 0 refills | Status: DC

## 2016-07-21 MED ORDER — IPRATROPIUM-ALBUTEROL 0.5-2.5 (3) MG/3ML IN SOLN
3.0000 mL | Freq: Once | RESPIRATORY_TRACT | Status: AC
  Administered 2016-07-21: 3 mL via RESPIRATORY_TRACT
  Filled 2016-07-21: qty 3

## 2016-07-21 MED ORDER — MOXIFLOXACIN HCL 400 MG PO TABS: 400.0000 mg | ORAL_TABLET | Freq: Every day | ORAL | 0 refills | Status: DC

## 2016-07-21 MED ORDER — PREDNISONE 10 MG (21) PO TBPK: ORAL_TABLET | Freq: Every day | ORAL | 0 refills | Status: DC

## 2016-07-21 MED ORDER — ALBUTEROL SULFATE HFA 108 (90 BASE) MCG/ACT IN AERS
2.0000 | INHALATION_SPRAY | Freq: Once | RESPIRATORY_TRACT | Status: AC
  Administered 2016-07-21: 2 via RESPIRATORY_TRACT
  Filled 2016-07-21: qty 6.7

## 2016-07-21 NOTE — Discharge Instructions (Signed)
Return to the ED with any concerns including difficulty breathing despite using albuterol every 4 hours, not drinking fluids, decreased urine output, vomiting and not able to keep down liquids or medications, decreased level of alertness/lethargy, or any other alarming symptoms °

## 2016-07-21 NOTE — ED Triage Notes (Addendum)
Pt reports 1 week history of chest pain upon inspiration, dizziness, chills, fatigue, shortness of breath, cough, tick bites. States he has an extensive COPD history. Pt out of Advair, Albuterol and Spiriva.

## 2016-07-21 NOTE — ED Provider Notes (Signed)
[facility]   CSN: [id number] Arrival date & time: 07/21/16  1303     History   Chief Complaint Chief Complaint  Patient presents with  . Chest Pain    HPI Dustin Steele is a 41 y.o. male.  HPI  Pt with hx of COPD presenting with c/o cough, wheezing, chest tightness presenting with ongoing cough and wheezing despite taking doxycycline and prednisone.  He was seen by a teledoc on 5/20 and 5/30 for similar symptoms and treated with prednsone doxycycline and tessalon perles.  He states his sputum has cleared from yellow but he continues to have bad cough and feels his chest is tight and wheezy.  No fever/chills.  No leg swelling. No hx of DVT/PE.  No recent travel/trauma/surgeryThere are no other associated systemic symptoms, there are no other alleviating or modifying factors.   Past Medical History:  Diagnosis Date  . Anxiety   . 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 COPD (chronic obstructive pulmonary disease) (HCC)   . Nicotine dependence     There are no active problems to display for this patient.   Past Surgical History:  Procedure Laterality Date  . FEMUR FRACTURE SURGERY    . FEMUR IM ROD REMOVAL         Home Medications    Prior to Admission medications   Medication Sig Start Date End Date Taking? Authorizing Provider  albuterol (PROVENTIL HFA;VENTOLIN HFA) 108 (90 Base) MCG/ACT inhaler Inhale into the lungs every 6 (six) hours as needed for wheezing or shortness of breath.   Yes [provider]  albuterol (PROVENTIL HFA;VENTOLIN HFA) 108 (90 Base) MCG/ACT inhaler Inhale 1-2 puffs into the lungs every 4 (four) hours as needed for wheezing or shortness of breath. 12/17/15  Yes Shaune PollackIsaacs, Cameron, MD  ALPRAZolam Prudy Feeler(XANAX) 0.25 MG tablet Take 0.25 mg by mouth at bedtime as needed for anxiety.   Yes [provider]  chlorpheniramine-HYDROcodone (TUSSIONEX PENNKINETIC ER) 10-8 MG/5ML SUER Take 5 mLs by mouth 2 (two) times daily. 07/21/16    Jerelyn ScottLinker, Emrys Mceachron, MD  fluticasone (FLONASE) 50 MCG/ACT nasal spray Place 2 sprays into both nostrils daily.    [provider]  Fluticasone-Salmeterol (ADVAIR) 250-50 MCG/DOSE AEPB Inhale 1 puff into the lungs 2 (two) times daily. 07/21/16   Jerelyn ScottLinker, Sophiea Ueda, MD  moxifloxacin (AVELOX) 400 MG tablet Take 1 tablet (400 mg total) by mouth daily at 8 pm. 07/21/16   Jerelyn ScottLinker, Aariyah Sampey, MD  oxyCODONE-acetaminophen (PERCOCET/ROXICET) 5-325 MG tablet Take 1 tablet by mouth every 6 (six) hours as needed for severe pain. 04/06/16   Mackuen, Courteney Lyn, MD  predniSONE (STERAPRED UNI-PAK 21 TAB) 10 MG (21) TBPK tablet Take by mouth daily. Take 6 tabs by mouth daily  for 2 days, then 5 tabs for 2 days, then 4 tabs for 2 days, then 3 tabs for 2 days, 2 tabs for 2 days, then 1 tab by mouth daily for 2 days 07/21/16   Jerelyn ScottLinker, Xara Paulding, MD  tiotropium (SPIRIVA) 18 MCG inhalation capsule Place 1 capsule (18 mcg total) into inhaler and inhale daily. 07/21/16   Jerelyn ScottLinker, [place], MD    Family History History reviewed. No pertinent family history.  Social History Social History  Substance Use Topics  . Smoking status: Current Every Day Smoker    Packs/day: 1.00  . Smokeless tobacco: Never Used  . Alcohol use 1.2 oz/week    2 Cans of beer per week     Comment: social  Allergies   Patient has no known allergies.   Review of Systems Review of Systems  ROS reviewed and all otherwise negative except for mentioned in HPI   Physical Exam Updated Vital Signs BP 130/85   Pulse (!) 103   Temp 98.4 F (36.9 C) (Oral)   Resp 17   Ht 5\' 5"  (1.651 m)   Wt 67.6 kg (149 lb)   SpO2 100%   BMI 24.79 kg/m  Vitals reviewed Physical Exam Physical Examination: General appearance - alert, well appearing, and in no distress Mental status - alert, oriented to person, place, and time Eyes - no conjunctival injection, no scleral icterus Mouth - mucous membranes moist, pharynx normal without lesions Neck - supple, no  significant adenopathy Chest - BSS, bilateral expiratory wheezing, normal respiraotory effort Heart - normal rate, regular rhythm, normal S1, S2, no murmurs, rubs, clicks or gallops Abdomen - soft, nontender, nondistended, no masses or organomegaly Neurological - alert, oriented, normal speech Extremities - peripheral pulses normal, no pedal edema, no clubbing or cyanosis Skin - normal coloration and turgor, no rashes  ED Treatments / Results  Labs (all labs ordered are listed, but only abnormal results are displayed) Labs Reviewed  CBC WITH DIFFERENTIAL/PLATELET - Abnormal; Notable for the following:       Result Value   Neutro Abs 7.8 (*)    All other components within normal limits  COMPREHENSIVE METABOLIC PANEL - Abnormal; Notable for the following:    Glucose, Bld 131 (*)    ALT 16 (*)    All other components within normal limits  TROPONIN I    EKG  EKG Interpretation  Date/Time:  Sunday July 21 2016 13:18:25 EDT Ventricular Rate:  80 PR Interval:  132 QRS Duration: 96 QT Interval:  386 QTC Calculation: 445 R Axis:   85 Text Interpretation:  Normal sinus rhythm Normal ECG Artifact Confirmed by Drema Pry ([id number]) on 07/22/2016 11:49:12 PM       Radiology No results found.  Date: 07/21/2016  Rate: 80  Rhythm: normal sinus rhythm  QRS Axis: normal  Intervals: normal  ST/T Wave abnormalities: normal  Conduction Disutrbances: none  Narrative Interpretation: unremarkable     Procedures Procedures (including critical care time)  Medications Ordered in ED Medications  ipratropium-albuterol (DUONEB) 0.5-2.5 (3) MG/3ML nebulizer solution 3 mL (3 mLs Nebulization Given 07/21/16 1316)  albuterol (PROVENTIL) (2.5 MG/3ML) 0.083% nebulizer solution 5 mg (5 mg Nebulization Given 07/21/16 1513)  ipratropium (ATROVENT) nebulizer solution 0.5 mg (0.5 mg Nebulization Given 07/21/16 1513)  methylPREDNISolone sodium succinate (SOLU-MEDROL) 125 mg/2 mL injection 125 mg (125 mg  Intravenous Given 07/21/16 1516)  albuterol (PROVENTIL HFA;VENTOLIN HFA) 108 (90 Base) MCG/ACT inhaler 2 puff (2 puffs Inhalation Given 07/21/16 1533)     Initial Impression / Assessment and Plan / ED Course  I have reviewed the triage vital signs and the nursing notes.  Pertinent labs & imaging results that were available during my care of the patient were reviewed by me and considered in my medical decision making (see chart for details).     Pt presenting with c/o cough and wheezing, he has completed doxycycline as well as course of steroids with ongoing symptoms.  CXR reassuring, other labs reassuring as well. Pt treated with nebs in the ED, will give steroid taper.  Also given course of moxifloxacin and tussionex cough medication for ongoing cough.  Given information for PMD followup as well.   Discharged with strict return precautions.  Pt agreeable with  plan.  Final Clinical Impressions(s) / ED Diagnoses   Final diagnoses:  COPD exacerbation (HCC)    New Prescriptions Discharge Medication List as of 07/21/2016  3:28 PM    START taking these medications   Details  chlorpheniramine-HYDROcodone (TUSSIONEX PENNKINETIC ER) 10-8 MG/5ML SUER Take 5 mLs by mouth 2 (two) times daily., Starting Sun 07/21/2016, Print    moxifloxacin (AVELOX) 400 MG tablet Take 1 tablet (400 mg total) by mouth daily at 8 pm., Starting Sun 07/21/2016, Print    predniSONE (STERAPRED UNI-PAK 21 TAB) 10 MG (21) TBPK tablet Take by mouth daily. Take 6 tabs by mouth daily  for 2 days, then 5 tabs for 2 days, then 4 tabs for 2 days, then 3 tabs for 2 days, 2 tabs for 2 days, then 1 tab by mouth daily for 2 days, Starting Sun 07/21/2016, Print         Jerelyn Scott, MD 07/25/16 [phone]

## 2016-08-28 ENCOUNTER — Institutional Professional Consult (permissible substitution): Payer: Self-pay | Admitting: Family Medicine

## 2016-09-23 ENCOUNTER — Encounter: Payer: Self-pay | Admitting: Family Medicine

## 2016-09-23 ENCOUNTER — Ambulatory Visit (INDEPENDENT_AMBULATORY_CARE_PROVIDER_SITE_OTHER): Payer: Commercial Managed Care - PPO | Admitting: Family Medicine

## 2016-09-23 VITALS — BP 124/82 | HR 88 | Resp 16 | Ht 66.5 in | Wt 144.8 lb

## 2016-09-23 DIAGNOSIS — F419 Anxiety disorder, unspecified: Secondary | ICD-10-CM | POA: Diagnosis not present

## 2016-09-23 DIAGNOSIS — F410 Panic disorder [episodic paroxysmal anxiety] without agoraphobia: Secondary | ICD-10-CM | POA: Diagnosis not present

## 2016-09-23 DIAGNOSIS — F17219 Nicotine dependence, cigarettes, with unspecified nicotine-induced disorders: Secondary | ICD-10-CM | POA: Diagnosis not present

## 2016-09-23 DIAGNOSIS — R942 Abnormal results of pulmonary function studies: Secondary | ICD-10-CM

## 2016-09-23 DIAGNOSIS — J441 Chronic obstructive pulmonary disease with (acute) exacerbation: Secondary | ICD-10-CM | POA: Diagnosis not present

## 2016-09-23 DIAGNOSIS — F909 Attention-deficit hyperactivity disorder, unspecified type: Secondary | ICD-10-CM | POA: Insufficient documentation

## 2016-09-23 DIAGNOSIS — F17200 Nicotine dependence, unspecified, uncomplicated: Secondary | ICD-10-CM | POA: Insufficient documentation

## 2016-09-23 DIAGNOSIS — Z7689 Persons encountering health services in other specified circumstances: Secondary | ICD-10-CM

## 2016-09-23 DIAGNOSIS — J449 Chronic obstructive pulmonary disease, unspecified: Secondary | ICD-10-CM | POA: Insufficient documentation

## 2016-09-23 NOTE — Progress Notes (Signed)
   Subjective:    Patient ID: Dustin Steele, male    DOB: 12-Aug-1975, [age] y.o.   MRN: [id number]  HPI Chief Complaint  Patient presents with  . new pt    new pt, get established.see psych on August 22 about Anxiety/ADHD. uses inhalers due to COPD and not Asthma. wants to take about quit smoking. no other concerns   He is new to the practice and here to establish care.  Moved here 8 months ago from [place]   States he was diagnosed with COPD 10-12 years ago. States he has been to the ED several times for COPD exacerbations since moving here. Has not seen a pulmonologist. Denies fever, chills, cough, shortness of breath, wheezing, chest pain, LE edema.    Smokes approximately 1 pack per day and has been smoking for the past 20 years. Has tried Chantix in the past. He reportedly had abnormal thoughts on this medication. He did not complete the course and started smoking again. He would like to discuss stopping smoking. Last took Chantix 2 years ago.   Using ventolin as needed. Has used this only 1 time in the past week.   States he had flu shot and pneumonia shot last October.   No medical records available today. Will need to get these.   [facility]. States he has history of anxiety, panic disorder, ADHD. Denies feeling depressed or anxious today.  Denies SI or HI.   Depression screen PHQ 2/9 09/23/2016  Decreased Interest 0  Down, Depressed, Hopeless 0  PHQ - 2 Score 0   Reviewed allergies, medications, past medical, surgical, and social history.   Review of Systems Pertinent positives and negatives in the history of present illness.     Objective:   Physical Exam BP 124/82   Pulse 88   Resp 16   Ht 5' 6.5" (1.689 m)   Wt 144 lb 12.8 oz (65.7 kg)   SpO2 98%   BMI 23.02 kg/m   Alert and in no distress. Tympanic membranes and canals are normal. Pharyngeal area is normal. Neck is supple without adenopathy or thyromegaly.  Cardiac exam shows a regular sinus rhythm without murmurs or gallops. Lungs are clear to auscultation.      Assessment & Plan:  COPD, frequent exacerbations (HCC) - Plan: Spirometry with graph, Ambulatory referral to Pulmonology  Cigarette nicotine dependence with nicotine-induced disorder  Anxiety  Attention deficit hyperactivity disorder (ADHD), unspecified ADHD type  Panic disorder  Encounter to establish care  Abnormal PFT - Plan: Ambulatory referral to Pulmonology  PFTs done and abnormal. FEV1/FVC 45%.  Reviewed multiple ED visits for COPD exacerbations.  Urgent referral made to pulmonology. He will continue on current medication regimen.  He is not is any physical or emotional distress today.  Recommend he see his psychiatrist as scheduled later this month for anxiety, panic, and ADHD.  Smoking cessation counseling done. Will hold off on medication for this until he sees his psychiatrist.  He will sign a release of medical records from his previous providers in [place].  Will have him return for a fasting CPE in 1-2 months.

## 2016-10-04 ENCOUNTER — Telehealth: Payer: Self-pay | Admitting: Internal Medicine

## 2016-10-04 NOTE — Telephone Encounter (Signed)
Received records from [facility]..sending back for review

## 2016-10-09 ENCOUNTER — Telehealth: Payer: Self-pay

## 2016-10-09 ENCOUNTER — Encounter: Payer: Self-pay | Admitting: Family Medicine

## 2016-10-09 NOTE — Telephone Encounter (Signed)
Records from [facility] via mail placed in your folder for review. Trixie Rude

## 2016-10-17 ENCOUNTER — Encounter (HOSPITAL_BASED_OUTPATIENT_CLINIC_OR_DEPARTMENT_OTHER): Payer: Self-pay

## 2016-10-17 ENCOUNTER — Emergency Department (HOSPITAL_BASED_OUTPATIENT_CLINIC_OR_DEPARTMENT_OTHER)
Admission: EM | Admit: 2016-10-17 | Discharge: 2016-10-17 | Disposition: A | Discharge status: Home / Self Care | Payer: Commercial Managed Care - PPO | Attending: Emergency Medicine | Admitting: Emergency Medicine

## 2016-10-17 DIAGNOSIS — F17210 Nicotine dependence, cigarettes, uncomplicated: Secondary | ICD-10-CM | POA: Insufficient documentation

## 2016-10-17 DIAGNOSIS — R0602 Shortness of breath: Secondary | ICD-10-CM

## 2016-10-17 DIAGNOSIS — J449 Chronic obstructive pulmonary disease, unspecified: Secondary | ICD-10-CM | POA: Insufficient documentation

## 2016-10-17 DIAGNOSIS — R072 Precordial pain: Secondary | ICD-10-CM

## 2016-10-17 DIAGNOSIS — Z79899 Other long term (current) drug therapy: Secondary | ICD-10-CM | POA: Diagnosis not present

## 2016-10-17 LAB — TROPONIN I

## 2016-10-17 MED ORDER — DEXAMETHASONE 4 MG PO TABS
4.0000 mg | ORAL_TABLET | Freq: Once | ORAL | Status: AC
  Administered 2016-10-17: 4 mg via ORAL
  Filled 2016-10-17: qty 1

## 2016-10-17 MED ORDER — IPRATROPIUM-ALBUTEROL 0.5-2.5 (3) MG/3ML IN SOLN
3.0000 mL | Freq: Four times a day (QID) | RESPIRATORY_TRACT | Status: DC
  Administered 2016-10-17: 3 mL via RESPIRATORY_TRACT
  Filled 2016-10-17: qty 3

## 2016-10-17 MED ORDER — HYDROCOD POLST-CPM POLST ER 10-8 MG/5ML PO SUER: 5.0000 mL | Freq: Every evening | ORAL | 0 refills | Status: DC | PRN

## 2016-10-17 MED ORDER — TIOTROPIUM BROMIDE MONOHYDRATE 18 MCG IN CAPS: 18.0000 ug | ORAL_CAPSULE | Freq: Every day | RESPIRATORY_TRACT | 0 refills | Status: DC

## 2016-10-17 MED ORDER — ALBUTEROL SULFATE (2.5 MG/3ML) 0.083% IN NEBU: 2.5000 mg | INHALATION_SOLUTION | Freq: Four times a day (QID) | RESPIRATORY_TRACT | 12 refills | Status: DC | PRN

## 2016-10-17 MED FILL — HYDROCODONE-CHLORPHENIRAM S: 10-8 | 23 days supply | Qty: 115 | Fill #0

## 2016-10-17 MED FILL — ALBUTEROL 0.083% INHAL SOLN: (2.5 MG/3ML | 5 days supply | Qty: 75 | Fill #0

## 2016-10-17 MED FILL — SPIRIVA 18 MCG CP-HANDIHALE: 18 | 30 days supply | Qty: 30 | Fill #0

## 2016-10-17 NOTE — ED Notes (Signed)
Pt reports recently starting buspar and adderall

## 2016-10-17 NOTE — ED Notes (Signed)
Pt ambulatory with steady gait at time of d/c. Directed to pharmacy to pick up Rx. Note for work given

## 2016-10-17 NOTE — ED Provider Notes (Signed)
[facility]   CSN: [id number] Arrival date & time: 10/17/16  0558     History   Chief Complaint Chief Complaint  Patient presents with  . Shortness of Breath    HPI Dustin Steele is a 41 y.o. male.  The history is provided by the patient.  Shortness of Breath  This is a recurrent problem. The problem occurs frequently.The current episode started more than 2 days ago. The problem has been gradually worsening. Associated symptoms include cough, wheezing and chest pain. Pertinent negatives include no fever, no hemoptysis, no syncope, no vomiting and no leg swelling.   Patient with known h/o COPD, currently smoking presents with 3 days of cough/wheezing and chest pain He reports it first "started as a sinus infection" and then "moved to my chest" He reports his CP has been persistent for up to 3 days He feels like " an elephant is on my chest" with associated cough He reports taking albuterol at home prior to arrival with minimal relief  No h/o CAD/VTE He has been referred to pulmonologist for his COPD  Past Medical History:  Diagnosis Date  . ADHD   . Anxiety   . 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 COPD (chronic obstructive pulmonary disease) (HCC)   . Nicotine dependence   . Panic disorder     Patient Active Problem List   Diagnosis Date Noted  . Abnormal PFT 09/23/2016  . COPD, frequent exacerbations (HCC)   . Nicotine dependence   . Anxiety   . ADHD   . Panic disorder     Past Surgical History:  Procedure Laterality Date  . FEMUR FRACTURE SURGERY    . FEMUR IM ROD REMOVAL         Home Medications    Prior to Admission medications   Medication Sig Start Date End Date Taking? Authorizing Provider  amphetamine-dextroamphetamine (ADDERALL) 10 MG tablet Take 10 mg by mouth 2 (two) times daily with a meal.   Yes [provider]  busPIRone (BUSPAR) 15 MG tablet Take 15 mg by mouth 3 (three) times daily.   Yes [provider]  albuterol (PROVENTIL HFA;VENTOLIN HFA) 108 (90 Base) MCG/ACT inhaler Inhale 1-2 puffs into the lungs every 4 (four) hours as needed for wheezing or shortness of breath. Patient taking differently: Inhale 2 puffs into the lungs every 4 (four) hours as needed for wheezing or shortness of breath.  12/17/15   Shaune Pollack, MD  ALPRAZolam Prudy Feeler) 0.5 MG tablet Take 0.5 mg by mouth 2 (two) times daily as needed for anxiety.    [provider]  Fluticasone-Salmeterol (ADVAIR) 250-50 MCG/DOSE AEPB Inhale 1 puff into the lungs 2 (two) times daily. 07/21/16   Mabe, Latanya Maudlin, MD  naproxen (NAPROSYN) 500 MG tablet Take 500 mg by mouth 2 (two) times daily with a meal.    [provider]  tiotropium (SPIRIVA) 18 MCG inhalation capsule Place 1 capsule (18 mcg total) into inhaler and inhale daily. 07/21/16   Mabe, Latanya Maudlin, MD  Tiotropium Bromide Monohydrate (SPIRIVA HANDIHALER IN) Place capsule in inhaler once a day    [provider]    Family History No family history on file.  Social History Social History  Substance Use Topics  . Smoking status: Current Every Day Smoker    Packs/day: 1.00    Years: 20.00    Types: Cigarettes  . Smokeless tobacco: Never Used  . Alcohol use No  Comment: social     Allergies   Patient has no known allergies.   Review of Systems Review of Systems  Constitutional: Negative for fever.  Respiratory: Positive for cough, shortness of breath and wheezing. Negative for hemoptysis.   Cardiovascular: Positive for chest pain. Negative for leg swelling and syncope.  Gastrointestinal: Negative for vomiting.  All other systems reviewed and are negative.    Physical Exam Updated Vital Signs BP 139/85 (BP Location: Right Arm)   Pulse 75   Temp 97.9 F (36.6 C) (Oral)   Resp 15   Ht 1.676 m (5\' 6" )   Wt 66.7 kg (147 lb)   SpO2 99%   BMI 23.73 kg/m   Physical Exam CONSTITUTIONAL: Well developed/well nourished, anxious HEAD:  Normocephalic/atraumatic EYES: EOMI/PERRL ENMT: Mucous membranes moist, uvula midline, no erythema/exudates NECK: supple no meningeal signs SPINE/BACK:entire spine nontender CV: S1/S2 noted, no murmurs/rubs/gallops noted LUNGS: Lungs are clear to auscultation bilaterally, no apparent distress, hyperventilating ABDOMEN: soft, nontender  GU:no cva tenderness NEURO: Pt is awake/alert/appropriate, moves all extremitiesx4.  No facial droop.   EXTREMITIES: pulses normal/equal, full ROM, no calf tenderness/edema SKIN: warm, color normal PSYCH: anxious  ED Treatments / Results  Labs (all labs ordered are listed, but only abnormal results are displayed) Labs Reviewed  TROPONIN I    EKG  EKG Interpretation  Date/Time:  Thursday October 17 2016 06:04:47 EDT Ventricular Rate:  81 PR Interval:    QRS Duration: 104 QT Interval:  383 QTC Calculation: 445 R Axis:   88 Text Interpretation:  Sinus rhythm ST elev, probable normal early repol pattern No significant change since last tracing Confirmed by Zadie RhineWickline, Alyssa Rotondo ([id number]) on 10/17/2016 6:11:50 AM       Radiology No results found.  Procedures Procedures    Medications Ordered in ED Medications  ipratropium-albuterol (DUONEB) 0.5-2.5 (3) MG/3ML nebulizer solution 3 mL (3 mLs Nebulization Given 10/17/16 0644)  dexamethasone (DECADRON) tablet 4 mg (4 mg Oral Given 10/17/16 [id number])     Initial Impression / Assessment and Plan / ED Course  I have reviewed the triage vital signs and the nursing notes.      6:35 AM Pt with h/o COPD presents with increased cough/sob and CP Pt reports this feels similar to prior COPD exacerbations.  However he keeps persisting about "an elephant on his chest" for 3 days and his lung sounds are clear EKG unchanged He thinks he will feel better with nebs/steroids Plan is to treat with duoneb and decadron One troponin to r/o MI If negative, he will be appropriate for d/c home Currently, I have low  suspicion for PE at this time 7:11 AM Pt stable Still mildly tachypneic Plan at signout to dr tegeler - f/u on troponin, ambulate with pulse ox, if improved d/c home  Final Clinical Impressions(s) / ED Diagnoses   Final diagnoses:  SOB (shortness of breath)  Precordial pain    New Prescriptions New Prescriptions   No medications on file     Zadie RhineWickline, Pamella Samons, MD 10/17/16 [phone]

## 2016-10-17 NOTE — ED Notes (Signed)
ED Provider at bedside. 

## 2016-10-17 NOTE — ED Triage Notes (Addendum)
Pt here POV with a complaint of SOB x 3 days. Pt has hx of COPD. Pt reports pain with deep breathing. Pt reports using albuterol inhaler this am with no relief. Pt SpO2 100%, chest expansion symmetrical and bilat lungs clear. Pt ambulated to room with steady gate and in NAD. Pt able to speak in complete sentences.

## 2016-10-17 NOTE — ED Provider Notes (Signed)
7:54 AM Care assumed from Dr. Bebe ShaggyWickline while awaiting results of troponin assay and reassessment.  Troponin was negative. Patient was able to ambulate with pulse oximetry monitoring and had no episodes of hypoxia or worsened symptoms of shortness of breath. Patient thinks that the steroids are helping.  Patient feels safe going home as his breathing has improved. Patient given prescription for albuterol as well as a refill of his home Spiriva. Patient also given cough medication. Patient will follow-up with his PCP and pulmonologist for further management. Patient understood return preca  utions. Patient had no questions or concerns and was discharged in good condition.   Clinical Impression: 1. SOB (shortness of breath)   2. Precordial pain     Disposition: Discharge  Condition: Good  I have discussed the results, Dx and Tx plan with the pt(& family if present). He/she/they expressed understanding and agree(s) with the plan. Discharge instructions discussed at great length. Strict return precautions discussed and pt &/or family have verbalized understanding of the instructions. No further questions at time of discharge.    New Prescriptions   ALBUTEROL (PROVENTIL) (2.5 MG/3ML) 0.083% NEBULIZER SOLUTION    Take 3 mLs (2.5 mg total) by nebulization every 6 (six) hours as needed for wheezing or shortness of breath.   CHLORPHENIRAMINE-HYDROCODONE (TUSSIONEX PENNKINETIC ER) 10-8 MG/5ML SUER    Take 5 mLs by mouth at bedtime as needed for cough.   TIOTROPIUM (SPIRIVA HANDIHALER) 18 MCG INHALATION CAPSULE    Place 1 capsule (18 mcg total) into inhaler and inhale daily.    Follow Up: Avanell ShackletonHenson, Vickie L, NP-C [address] [place] [place] [address] [phone]  In 1 week      Tegeler, Canary Brimhristopher J, MD 10/17/16 [phone]

## 2016-10-17 NOTE — Discharge Instructions (Signed)

## 2016-10-30 ENCOUNTER — Institutional Professional Consult (permissible substitution): Payer: Commercial Managed Care - PPO | Admitting: Internal Medicine

## 2016-11-01 ENCOUNTER — Institutional Professional Consult (permissible substitution): Payer: Commercial Managed Care - PPO | Admitting: Internal Medicine

## 2016-12-11 ENCOUNTER — Encounter (HOSPITAL_BASED_OUTPATIENT_CLINIC_OR_DEPARTMENT_OTHER): Payer: Self-pay | Admitting: *Deleted

## 2016-12-11 ENCOUNTER — Emergency Department (HOSPITAL_BASED_OUTPATIENT_CLINIC_OR_DEPARTMENT_OTHER): Payer: Commercial Managed Care - PPO

## 2016-12-11 ENCOUNTER — Emergency Department (HOSPITAL_BASED_OUTPATIENT_CLINIC_OR_DEPARTMENT_OTHER)
Admission: EM | Admit: 2016-12-11 | Discharge: 2016-12-11 | Disposition: A | Discharge status: Home / Self Care | Payer: Commercial Managed Care - PPO | Attending: Emergency Medicine | Admitting: Emergency Medicine

## 2016-12-11 DIAGNOSIS — Z79899 Other long term (current) drug therapy: Secondary | ICD-10-CM | POA: Diagnosis not present

## 2016-12-11 DIAGNOSIS — R0602 Shortness of breath: Secondary | ICD-10-CM | POA: Diagnosis present

## 2016-12-11 DIAGNOSIS — F17210 Nicotine dependence, cigarettes, uncomplicated: Secondary | ICD-10-CM | POA: Diagnosis not present

## 2016-12-11 DIAGNOSIS — J441 Chronic obstructive pulmonary disease with (acute) exacerbation: Secondary | ICD-10-CM

## 2016-12-11 LAB — CBC WITH DIFFERENTIAL/PLATELET
BASOS ABS: 0 10*3/uL (ref 0.0–0.1)
Basophils Relative: 0 %
EOS PCT: 0 %
Eosinophils Absolute: 0 10*3/uL (ref 0.0–0.7)
HEMATOCRIT: 43 % (ref 39.0–52.0)
Hemoglobin: 15.1 g/dL (ref 13.0–17.0)
Lymphocytes Relative: 6 %
Lymphs Abs: 1.2 10*3/uL (ref 0.7–4.0)
MCH: 31.3 pg (ref 26.0–34.0)
MCHC: 35.1 g/dL (ref 30.0–36.0)
MCV: 89 fL (ref 78.0–100.0)
MONO ABS: 0.8 10*3/uL (ref 0.1–1.0)
MONOS PCT: 5 %
NEUTROS PCT: 89 %
Neutro Abs: 16.4 10*3/uL — ABNORMAL HIGH (ref 1.7–7.7)
PLATELETS: 189 10*3/uL (ref 150–400)
RBC: 4.83 MIL/uL (ref 4.22–5.81)
RDW: 13.2 % (ref 11.5–15.5)
WBC: 18.4 10*3/uL — ABNORMAL HIGH (ref 4.0–10.5)

## 2016-12-11 LAB — BASIC METABOLIC PANEL
Anion gap: 10 (ref 5–15)
BUN: 17 mg/dL (ref 6–20)
CALCIUM: 9.8 mg/dL (ref 8.9–10.3)
CHLORIDE: 104 mmol/L (ref 101–111)
CO2: 22 mmol/L (ref 22–32)
CREATININE: 0.78 mg/dL (ref 0.61–1.24)
GFR calc non Af Amer: >60 mL/min (ref >60)
GLUCOSE: 111 mg/dL — AB (ref 65–99)
Potassium: 3.6 mmol/L (ref 3.5–5.1)
Sodium: 136 mmol/L (ref 135–145)

## 2016-12-11 LAB — TROPONIN I: Troponin I: <0.03 ng/mL (ref <0.03)

## 2016-12-11 LAB — D-DIMER, QUANTITATIVE: D-Dimer, Quant: <0.27 ug/mL-FEU (ref 0.00–0.50)

## 2016-12-11 MED ORDER — HYDROCOD POLST-CPM POLST ER 10-8 MG/5ML PO SUER: 5.0000 mL | Freq: Two times a day (BID) | ORAL | 0 refills | Status: DC | PRN

## 2016-12-11 MED ORDER — PREDNISONE 50 MG PO TABS
60.0000 mg | ORAL_TABLET | Freq: Once | ORAL | Status: AC
  Administered 2016-12-11: 60 mg via ORAL
  Filled 2016-12-11: qty 1

## 2016-12-11 MED ORDER — PREDNISONE 10 MG (21) PO TBPK: ORAL_TABLET | Freq: Every day | ORAL | 0 refills | Status: DC

## 2016-12-11 MED ORDER — TIOTROPIUM BROMIDE MONOHYDRATE 18 MCG IN CAPS: 18.0000 ug | ORAL_CAPSULE | Freq: Every day | RESPIRATORY_TRACT | 0 refills | Status: DC

## 2016-12-11 MED ORDER — IPRATROPIUM-ALBUTEROL 0.5-2.5 (3) MG/3ML IN SOLN
3.0000 mL | Freq: Four times a day (QID) | RESPIRATORY_TRACT | Status: DC
  Administered 2016-12-11: 3 mL via RESPIRATORY_TRACT
  Filled 2016-12-11: qty 3

## 2016-12-11 MED ORDER — ALBUTEROL (5 MG/ML) CONTINUOUS INHALATION SOLN
5.0000 mg | INHALATION_SOLUTION | Freq: Once | RESPIRATORY_TRACT | Status: AC
  Administered 2016-12-11: 5 mg via RESPIRATORY_TRACT
  Filled 2016-12-11: qty 20

## 2016-12-11 MED ORDER — FLUTICASONE-SALMETEROL 250-50 MCG/DOSE IN AEPB: 1.0000 | INHALATION_SPRAY | Freq: Two times a day (BID) | RESPIRATORY_TRACT | 0 refills | Status: DC

## 2016-12-11 NOTE — Discharge Instructions (Signed)
Take prednisone taper has prescribed until all gone.  Did not take any other steroids at the same time.  Finish Z-Pak.  Take Tussionex as prescribed as needed for cough.  Continue all your other regular medications.  Please follow-up with family doctor as needed.  Return if worsening.

## 2016-12-11 NOTE — ED Triage Notes (Signed)
Pt c/o SOB and dry cough. Chills  x 4 days

## 2016-12-11 NOTE — ED Provider Notes (Signed)
[facility] EMERGENCY DEPARTMENT Provider Note   CSN: [id number] Arrival date & time: 12/11/16  1732     History   Chief Complaint Chief Complaint  Patient presents with  . Shortness of Breath    HPI Dustin Steele is a 41 y.o. male.  HPI Dustin ArdsJohn M Steele is a 41 y.o. male with history of anxiety, COPD, panic disorder, presents to emergency department complaining of chest pain or shortness of breath.  Patient states he has had increased shortness of breath for the last several days.  He states it feels like prior COPD exacerbation.  He reports nonproductive dry cough.  He also states he is having sharp left-sided chest pain.  Pain is not worsened with exertion or movement.  It is worse with breathing and coughing.  He states that the pain in his chest is worse than ever before.  He did a tele-doc yesterday and got a prescription for Z-Pak and prednisolone 20 mg.  He states this is a low dose and has not worked for him.  He states Z-Pak never works for him.  He states today his symptoms worsen so he decided to come to the emergency department.  He did use breathing treatment at home, last breathing treatment was 6 hours ago, and states it did not help.  He states in the past when his breathing was this bad he had to be admitted.  Past Medical History:  Diagnosis Date  . ADHD   . Anxiety   . 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 COPD (chronic obstructive pulmonary disease) (HCC)   . Nicotine dependence   . Panic disorder     Patient Active Problem List   Diagnosis Date Noted  . Abnormal PFT 09/23/2016  . COPD, frequent exacerbations (HCC)   . Nicotine dependence   . Anxiety   . ADHD   . Panic disorder     Past Surgical History:  Procedure Laterality Date  . FEMUR FRACTURE SURGERY    . FEMUR IM ROD REMOVAL         Home Medications    Prior to Admission medications   Medication Sig Start Date End Date Taking? Authorizing Provider  albuterol (PROVENTIL) (2.5 MG/3ML)  0.083% nebulizer solution Take 3 mLs (2.5 mg total) by nebulization every 6 (six) hours as needed for wheezing or shortness of breath. 10/17/16   Zadie RhineWickline, Donald, MD  ALPRAZolam Prudy Feeler(XANAX) 0.5 MG tablet Take 0.5 mg by mouth 2 (two) times daily as needed for anxiety.    [provider]  amphetamine-dextroamphetamine (ADDERALL) 10 MG tablet Take 10 mg by mouth 2 (two) times daily with a meal.    [provider]  busPIRone (BUSPAR) 15 MG tablet Take 15 mg by mouth 3 (three) times daily.    [provider]  chlorpheniramine-HYDROcodone (TUSSIONEX PENNKINETIC ER) 10-8 MG/5ML SUER Take 5 mLs by mouth at bedtime as needed for cough. 10/17/16   Zadie RhineWickline, Donald, MD  Fluticasone-Salmeterol (ADVAIR) 250-50 MCG/DOSE AEPB Inhale 1 puff into the lungs 2 (two) times daily. 07/21/16   Mabe, Latanya MaudlinMartha L, MD  tiotropium (SPIRIVA HANDIHALER) 18 MCG inhalation capsule Place 1 capsule (18 mcg total) into inhaler and inhale daily. 10/17/16   Tegeler, Canary Brimhristopher J, MD  tiotropium (SPIRIVA) 18 MCG inhalation capsule Place 1 capsule (18 mcg total) into inhaler and inhale daily. 07/21/16   Mabe, Latanya MaudlinMartha L, MD  Tiotropium Bromide Monohydrate (SPIRIVA HANDIHALER IN) Place capsule in inhaler once a day    [provider]    Family History History reviewed. No pertinent family history.  Social History Social History  Substance Use Topics  . Smoking status: Current Every Day Smoker    Packs/day: 0.50    Years: 20.00    Types: Cigarettes  . Smokeless tobacco: Never Used  . Alcohol use No     Comment: social     Allergies   Patient has no known allergies.   Review of Systems Review of Systems  Constitutional: Negative for chills and fever.  Respiratory: Positive for chest tightness and shortness of breath. Negative for cough.   Cardiovascular: Positive for chest pain. Negative for palpitations and leg swelling.  Gastrointestinal: Negative for abdominal distention, abdominal pain,  diarrhea, nausea and vomiting.  Genitourinary: Negative for dysuria, frequency, hematuria and urgency.  Musculoskeletal: Negative for arthralgias, myalgias, neck pain and neck stiffness.  Skin: Negative for rash.  Allergic/Immunologic: Negative for immunocompromised state.  Neurological: Negative for dizziness, weakness, light-headedness, numbness and headaches.  All other systems reviewed and are negative.    Physical Exam Updated Vital Signs BP 135/87 (BP Location: Left Arm)   Pulse (!) 105   Temp 98.5 F (36.9 C)   Resp 20   Ht 5\' 5"  (1.651 m)   Wt 61.2 kg (135 lb)   SpO2 100%   BMI 22.47 kg/m   Physical Exam  Constitutional: He appears well-developed and well-nourished. No distress.  HENT:  Head: Normocephalic and atraumatic.  Eyes: Conjunctivae are normal.  Neck: Neck supple.  Cardiovascular: Normal rate, regular rhythm and normal heart sounds.   Pulmonary/Chest: Effort normal. No respiratory distress. He has no wheezes. He has no rales.  Diminished air movement bilaterally  Abdominal: Soft. Bowel sounds are normal. He exhibits no distension. There is no tenderness. There is no rebound.  Musculoskeletal: He exhibits no edema.  Neurological: He is alert.  Skin: Skin is warm and dry.  Nursing note and vitals reviewed.    ED Treatments / Results  Labs (all labs ordered are listed, but only abnormal results are displayed) Labs Reviewed  CBC WITH DIFFERENTIAL/PLATELET - Abnormal; Notable for the following:       Result Value   WBC 18.4 (*)    Neutro Abs 16.4 (*)    All other components within normal limits  BASIC METABOLIC PANEL - Abnormal; Notable for the following:    Glucose, Bld 111 (*)    All other components within normal limits  D-DIMER, QUANTITATIVE (NOT AT [facility])  TROPONIN I    EKG  EKG Interpretation  Date/Time:  Wednesday December 11 2016 19:55:22 EDT Ventricular Rate:  90 PR Interval:    QRS Duration: 107 QT Interval:  376 QTC  Calculation: 461 R Axis:   83 Text Interpretation:  Sinus rhythm Consider right atrial enlargement When compared to prior, no significant changes seen.  No STEMI Confirmed by Theda Belfast ([id number]) on 12/11/2016 8:05:20 PM       Radiology Dg Chest 2 View  Result Date: 12/11/2016 CLINICAL DATA:  Cough for 2 weeks, shortness of Breath EXAM: CHEST  2 VIEW COMPARISON:  07/21/2016 FINDINGS: Heart and mediastinal contours are within normal limits. No focal opacities or effusions. No acute bony abnormality. IMPRESSION: No active cardiopulmonary disease. Electronically Signed   By: Charlett Nose M.D.   On: 12/11/2016 19:44    Procedures Procedures (including critical care time)  Medications Ordered in ED Medications  ipratropium-albuterol (DUONEB) 0.5-2.5 (3) MG/3ML nebulizer solution 3 mL (3 mLs Nebulization Given 12/11/16 1942)  predniSONE (DELTASONE) tablet 60 mg (60 mg Oral Given 12/11/16 1949)  albuterol (PROVENTIL,VENTOLIN) solution continuous neb (5 mg Nebulization Given 12/11/16 2011)     Initial Impression / Assessment and Plan / ED Course  I have reviewed the triage vital signs and the nursing notes.  Pertinent labs & imaging results that were available during my care of the patient were reviewed by me and considered in my medical decision making (see chart for details).    Patient with pleuritic left-sided chest pain, constant, sharp.  Concerning for possible PE.  Patient is tachycardic with heart rate of 105.  He is not hypoxic.  We will get a d-dimer, low risk for PE.  Patient with extensive history of COPD, he continues to smoke, discussed cessation with him.  Currently on Spiriva, Advair, DuoNeb's at home, states that he has been compliant with all of these medications.  Last steroids were month and a half ago.  Patient appears to be very anxious, we discussed this being a possibility of a panic attack, however he states that he is certain that this is not related to his anxiety.   As he said that, his wife said that he is anxious because it is her birthday today and he forgot.  Will order a DuoNeb, prednisone, labs including a d-dimer.  Chest x-ray.  9:22 PM The patient received 2 breathing treatments.  He is feeling much better.  Chest x-ray came back negative. D dimer and trop negative.  His white count did come back at 18,000, question from starting steroids yesterday.  He does not have a fever here.  He does not have any change in his sputum, no productive sputum with his cough.  His symptoms are similar to prior COPD exacerbation.  He was started on Z-Pak yesterday, instructed to continue.  He is requesting prescription for Tussionex for his cough at home.  He is also requesting a longer prednisone taper than what he got yesterday.  He states short courses of steroids normally does not work for him.  Patient has not had steroids since August.  I will discharge him home with longer course of steroids, instructed to follow-up with family doctor.  He also requests a refill of his Spiriva and advair  Vitals:   12/11/16 2000 12/11/16 2011 12/11/16 2034 12/11/16 2111  BP: 139/86  138/75   Pulse: 92  (!) 102   Resp: (!) 8  (!) 25   Temp:      SpO2: 100% 100% 100% 100%  Weight:      Height:          Final Clinical Impressions(s) / ED Diagnoses   Final diagnoses:  COPD exacerbation (HCC)    New Prescriptions New Prescriptions   CHLORPHENIRAMINE-HYDROCODONE (TUSSIONEX PENNKINETIC ER) 10-8 MG/5ML SUER    Take 5 mLs by mouth every 12 (twelve) hours as needed for cough.   PREDNISONE (STERAPRED UNI-PAK 21 TAB) 10 MG (21) TBPK TABLET    Take by mouth daily. Take 6 tabs by mouth daily  for 2 days, then 5 tabs for 2 days, then 4 tabs for 2 days, then 3 tabs for 2 days, 2 tabs for 2 days, then 1 tab by mouth daily for 2 days     Jaynie Crumble, Cordelia Poche 12/11/16 2128    Tegeler, Canary Brim, MD 12/11/16 [phone]

## 2016-12-12 MED FILL — SPIRIVA 18 MCG CP-HANDIHALE: 18 | 30 days supply | Qty: 30 | Fill #0

## 2016-12-12 MED FILL — ADVAIR 250/50 DISKUS: 250-50 | 30 days supply | Qty: 60 | Fill #0

## 2016-12-30 ENCOUNTER — Ambulatory Visit (HOSPITAL_COMMUNITY)
Admission: RE | Admit: 2016-12-30 | Discharge: 2016-12-30 | Disposition: A | Discharge status: Home / Self Care | Payer: Commercial Managed Care - PPO | Attending: Psychiatry | Admitting: Psychiatry

## 2016-12-30 DIAGNOSIS — R454 Irritability and anger: Secondary | ICD-10-CM | POA: Diagnosis not present

## 2016-12-30 DIAGNOSIS — F419 Anxiety disorder, unspecified: Secondary | ICD-10-CM | POA: Diagnosis not present

## 2016-12-30 DIAGNOSIS — R451 Restlessness and agitation: Secondary | ICD-10-CM | POA: Diagnosis not present

## 2016-12-30 NOTE — BH Assessment (Addendum)
Assessment Note  Dustin Steele is a 41 y.o. male who [facility] due to increasing anxiety. Pt denies SI, HI, AVH. Pt reports thoughts of self harm, such as punching a wall or banging his head into a wall, as a means to release the pent-up anxiety and associated frustration that he has. Pt reports being prescribed Welbutrin and taking it for a little over a month. He d/c it 3 days ago, w/ provider consent, due to adverse affects (increase in desire to self harm and increase in anxiety). Pt reports current symptoms of tearfulness and irritability.    Diagnosis: GAD, ADHD, PTSD, Panic disorder  Past Medical History:  Past Medical History:  Diagnosis Date  . ADHD   . Anxiety   . 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 COPD (chronic obstructive pulmonary disease) (HCC)   . Nicotine dependence   . Panic disorder     Past Surgical History:  Procedure Laterality Date  . FEMUR FRACTURE SURGERY    . FEMUR IM ROD REMOVAL      Family History: No family history on file.  Social History:  reports that he has been smoking cigarettes.  He has a 10.00 pack-year smoking history. he has never used smokeless tobacco. He reports that he uses drugs. Drug: Marijuana. He reports that he does not drink alcohol.  Additional Social History:  Alcohol / Drug Use Pain Medications: see PTA meds Prescriptions: see PTA meds Over the Counter: see PTA meds History of alcohol / drug use?: No history of alcohol / drug abuse(Pt reports occasional marijuana use. )  CIWA: CIWA-Ar BP: 114/71 Pulse Rate: 76 COWS:    Allergies: No Known Allergies  Home Medications:  (Not in a hospital admission)  OB/GYN Status:  No LMP for male patient.  General Assessment Data [facility] TTS Assessment: In system Is this a Tele or Face-to-Face Assessment?: Face-to-Face Is this an Initial Assessment or a Re-assessment for this encounter?: Initial Assessment Marital status:  Married Living Arrangements: Spouse/significant other Can pt return to current living arrangement?: Yes Admission Status: Voluntary Is patient capable of signing voluntary admission?: Yes Referral Source: Self/Family/Friend Insurance type: UMR  Medical Screening Exam [place]([facility] Walk-in ONLY) Medical Exam completed: Yes  Crisis Care Plan Living Arrangements: Spouse/significant other Name of Psychiatrist: Sharman CrateLaurie Arena Name of Therapist: none  Education Status Is patient currently in school?: No  Risk to self with the past 6 months Suicidal Ideation: No Has patient been a risk to self within the past 6 months prior to admission? : No Suicidal Intent: No Has patient had any suicidal intent within the past 6 months prior to admission? : No Is patient at risk for suicide?: No Suicidal Plan?: No Has patient had any suicidal plan within the past 6 months prior to admission? : No Access to Means: No Previous Attempts/Gestures: No Intentional Self Injurious Behavior: Damaging Comment - Self Injurious Behavior: Pt will punch walls and other related bx when extremely anxious. Family Suicide History: Unknown Recent stressful life event(s): Other (Comment) Persecutory voices/beliefs?: No Depression: No Substance abuse history and/or treatment for substance abuse?: No Suicide prevention information given to non-admitted patients: Yes  Risk to Others within the past 6 months Homicidal Ideation: No Does patient have any lifetime risk of violence toward others beyond the six months prior to admission? : No Thoughts of Harm to Others: No Current Homicidal Intent: No Current Homicidal Plan: No Access to Homicidal Means: No  History of harm to others?: No Assessment of Violence: None Noted Does patient have access to weapons?: No Criminal Charges Pending?: No Does patient have a court date: No Is patient on probation?: No  Psychosis Hallucinations: None noted Delusions: None noted  Mental  Status Report Appearance/Hygiene: Unremarkable Eye Contact: Good Motor Activity: Unremarkable Speech: Logical/coherent Level of Consciousness: Alert Mood: Anxious, Irritable Affect: Irritable, Anxious, Appropriate to circumstance Anxiety Level: Moderate Thought Processes: Coherent, Relevant Judgement: Partial Orientation: Person, Situation, Place, Time Obsessive Compulsive Thoughts/Behaviors: None  Cognitive Functioning Concentration: Normal Memory: Recent Intact, Remote Intact IQ: Average Insight: Good Impulse Control: Good Appetite: Good Sleep: No Change Vegetative Symptoms: None  ADLScreening [facility] Assessment Services) Patient's cognitive ability adequate to safely complete daily activities?: Yes Patient able to express need for assistance with ADLs?: Yes Independently performs ADLs?: Yes (appropriate for developmental age)  Prior Inpatient Therapy Prior Inpatient Therapy: No  Prior Outpatient Therapy Prior Outpatient Therapy: No Does patient have an ACCT team?: No Does patient have Intensive In-House Services?  : No Does patient have [facility] services? : No Does patient have P4CC services?: No  ADL Screening (condition at time of admission) Patient's cognitive ability adequate to safely complete daily activities?: Yes Is the patient deaf or have difficulty hearing?: No Does the patient have difficulty seeing, even when wearing glasses/contacts?: No Does the patient have difficulty concentrating, remembering, or making decisions?: No Patient able to express need for assistance with ADLs?: Yes Does the patient have difficulty dressing or bathing?: No Independently performs ADLs?: Yes (appropriate for developmental age) Does the patient have difficulty walking or climbing stairs?: No Weakness of Legs: None Weakness of Arms/Hands: None  Home Assistive Devices/Equipment Home Assistive Devices/Equipment: None  Therapy Consults (therapy consults require a physician  order) PT Evaluation Needed: No OT Evalulation Needed: No SLP Evaluation Needed: No Abuse/Neglect Assessment (Assessment to be complete while patient is alone) Abuse/Neglect Assessment Can Be Completed: Yes Physical Abuse: Denies Verbal Abuse: Denies Sexual Abuse: Denies Exploitation of patient/patient's resources: Denies Self-Neglect: Denies Values / Beliefs Cultural Requests During Hospitalization: None Spiritual Requests During Hospitalization: None Consults Spiritual Care Consult Needed: No Social Work Consult Needed: No Merchant navy officerAdvance Directives (For Healthcare) Does Patient Have a Medical Advance Directive?: No Would patient like information on creating a medical advance directive?: No - Patient declined Nutrition Screen- [facility] Adult/WL/AP Patient's home diet: Regular Has the patient recently lost weight without trying?: No Has the patient been eating poorly because of a decreased appetite?: Yes Malnutrition Screening Tool Score: 1  Additional Information 1:1 In Past 12 Months?: No CIRT Risk: No Elopement Risk: No Does patient have medical clearance?: Yes     Disposition: Pt's current provider was called and they will look into calling in something for pt's increased anxiety.  Disposition Initial Assessment Completed for this Encounter: Yes(consulted with Fransisca KaufmannLaura Davis, PMHNP) Disposition of Patient: Other dispositions Other disposition(s): To current provider  On Site Evaluation by:   Reviewed with Physician:    Laddie AquasSamantha M [employer] 12/30/2016 10:17 AM

## 2016-12-30 NOTE — H&P (Signed)
Behavioral Health Medical Screening Exam  Dustin Steele is an 41 y.o. male who presents with his wife as a walk in due to severe anxiety. He reports that he is a patient of [facility] and was recently seen as a follow up last week for similar symptoms and was taken off Wellbutrin due to concern it was causing increased anxiety. Patient states "I woke up very anxious and could not catch my breath. It makes me worry that because of the anxiety that I might punch my hand in the wall." Patient has [facility].   Total Time spent with patient: 20 minutes  Psychiatric Specialty Exam: Physical Exam  Constitutional: He is oriented to person, place, and time. He appears well-developed and well-nourished.  HENT:  Head: Normocephalic and atraumatic.  Neck: Normal range of motion.  Cardiovascular: Normal rate, regular rhythm, normal heart sounds and intact distal pulses.  Respiratory: Effort normal and breath sounds normal.  GI: Soft. Bowel sounds are normal.  Musculoskeletal: Normal range of motion.  Neurological: He is alert and oriented to person, place, and time.  Skin: Skin is warm and dry.    ROS  Blood pressure 114/71, pulse 76, temperature 98.9 F (37.2 C), resp. rate 16, SpO2 100 %.There is no height or weight on file to calculate BMI.  General Appearance: Casual  Eye Contact:  Good  Speech:  Clear and Coherent  Volume:  Normal  Mood:  Anxious and Irritable  Affect:  Congruent  Thought Process:  Coherent and Goal Directed  Orientation:  Full (Time, Place, and Person)  Thought Content:  Severe symptoms of anxiety   Suicidal Thoughts:  No  Homicidal Thoughts:  No  Memory:  Immediate;   Good Recent;   Good Remote;   Good  Judgement:  Intact  Insight:  Present  Psychomotor Activity:  Restlessness  Concentration: Concentration: Good and Attention Span: Good  Recall:  Good  Fund of Knowledge:Good  Language: Good  Akathisia:   No  Handed:  Right  AIMS (if indicated):     Assets:  Communication Skills Desire for Improvement Financial Resources/Insurance Housing Intimacy Leisure Time Physical Health Resilience Social Support  Sleep:       Musculoskeletal: Strength & Muscle Tone: within normal limits Gait & Station: normal Patient leans: N/A  Blood pressure 114/71, pulse 76, temperature 98.9 F (37.2 C), resp. rate 16, SpO2 100 %.  Recommendations:  TTS staff to contact [facility] to inform of severe anxiety symptoms with need for medication adjustment today by current Provider. Patient at this time does not meet criteria for inpatient admission and states "I think that environment would make me even more anxious being around all those people."   Based on my evaluation the patient does not appear to have an emergency medical condition.  Fransisca KaufmannAVIS, Raliyah Montella, NP 12/30/2016, 10:10 AM

## 2017-01-17 ENCOUNTER — Ambulatory Visit: Payer: Commercial Managed Care - PPO | Admitting: Internal Medicine

## 2017-01-17 ENCOUNTER — Other Ambulatory Visit (INDEPENDENT_AMBULATORY_CARE_PROVIDER_SITE_OTHER): Payer: Commercial Managed Care - PPO

## 2017-01-17 ENCOUNTER — Encounter: Payer: Self-pay | Admitting: Internal Medicine

## 2017-01-17 VITALS — BP 108/70 | HR 117 | Ht 65.0 in | Wt 130.2 lb

## 2017-01-17 DIAGNOSIS — J449 Chronic obstructive pulmonary disease, unspecified: Secondary | ICD-10-CM

## 2017-01-17 DIAGNOSIS — F17210 Nicotine dependence, cigarettes, uncomplicated: Secondary | ICD-10-CM | POA: Diagnosis not present

## 2017-01-17 LAB — CBC WITH DIFFERENTIAL/PLATELET
BASOS PCT: 1.3 % (ref 0.0–3.0)
Basophils Absolute: 0.1 10*3/uL (ref 0.0–0.1)
EOS ABS: 0.2 10*3/uL (ref 0.0–0.7)
EOS PCT: 2 % (ref 0.0–5.0)
HEMATOCRIT: 48.7 % (ref 39.0–52.0)
HEMOGLOBIN: 16.1 g/dL (ref 13.0–17.0)
LYMPHS PCT: 24.9 % (ref 12.0–46.0)
Lymphs Abs: 2 10*3/uL (ref 0.7–4.0)
MCHC: 33 g/dL (ref 30.0–36.0)
MCV: 95.2 fl (ref 78.0–100.0)
MONO ABS: 0.5 10*3/uL (ref 0.1–1.0)
Monocytes Relative: 6.5 % (ref 3.0–12.0)
Neutro Abs: 5.3 10*3/uL (ref 1.4–7.7)
Neutrophils Relative %: 65.3 % (ref 43.0–77.0)
Platelets: 266 10*3/uL (ref 150.0–400.0)
RBC: 5.12 Mil/uL (ref 4.22–5.81)
RDW: 13.6 % (ref 11.5–15.5)
WBC: 8.1 10*3/uL (ref 4.0–10.5)

## 2017-01-17 MED ORDER — BUDESONIDE-FORMOTEROL FUMARATE 160-4.5 MCG/ACT IN AERO: 2.0000 | INHALATION_SPRAY | Freq: Two times a day (BID) | RESPIRATORY_TRACT | 11 refills | Status: DC

## 2017-01-17 MED ORDER — BUDESONIDE-FORMOTEROL FUMARATE 160-4.5 MCG/ACT IN AERO: 2.0000 | INHALATION_SPRAY | Freq: Two times a day (BID) | RESPIRATORY_TRACT | 0 refills | Status: DC

## 2017-01-17 NOTE — Progress Notes (Signed)
Subjective:     Patient ID: Dustin Steele, male   DOB: [birth year], 41 y.o.   MRN: [id number]  HPI  [id number] [place]owm [place]  then age16 month hosp with "bronchitis" with 02 but no vent with no need for inhalers in middle school or HS and good ex tol (soccer ) and could run a  [place] in 6 : 10 sec but by mid 20s noted doe and need for maint rx since age 41 = spiriva/advair dpi's referred to pulmonary clinic 01/17/2017 by Dustin BoerVicki Steele   01/17/2017 1st [facility] office visit/ Dustin Steele   Chief Complaint  Patient presents with  . Follow-up    consult for COPD exacerbations up to 3 times a year. 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 No unusual exposure hx or knowledge of premature birth.  Sleeping ok now flat without nocturnal  or early am exacerbation  of respiratory  c/o's or need for noct saba. Also denies any obvious fluctuation of symptoms with weather or environmental changes or other aggravating or alleviating factors except as outlined above   Current Allergies, Complete Past Medical History, Past Surgical History, Family History, and Social History were reviewed in [facility] electronic medical record.  ROS  The following are not active complaints unless bolded Hoarseness, sore throat, dysphagia, dental problems, itching, sneezing,  nasal congestion or discharge of excess mucus or purulent secretions, ear ache,   fever, chills, sweats, unintended wt loss or wt gain,  classically pleuritic or exertional cp,  orthopnea pnd or leg swelling, presyncope, palpitations, abdominal pain, anorexia, nausea, vomiting, diarrhea  or change in bowel habits or change in bladder habits, change in stools or change in urine, dysuria, hematuria,  rash, arthralgias, visual complaints, headache, numbness, weakness or ataxia or problems with walking or coordination,  change in mood/affect or memory.          Current Meds  Medication Sig  . albuterol (PROVENTIL) (2.5 MG/3ML) 0.083% nebulizer solution Take 3 mLs (2.5 mg total) by nebulization every 6 (six) hours as needed for wheezing or shortness of breath.  . ALPRAZolam (XANAX) 0.5 MG tablet Take 0.5 mg by mouth 2 (two) times daily as needed for anxiety.  . busPIRone (BUSPAR) 15 MG tablet Take 15 mg by mouth 3 (three) times daily.  . Fluticasone-Salmeterol (ADVAIR) 250-50 MCG/DOSE AEPB Inhale 1 puff into the lungs 2 (two) times daily.  Marland Kitchen. lithium carbonate 300 MG capsule Take 300 mg by mouth daily.  Marland Kitchen. tiotropium (SPIRIVA HANDIHALER) 18 MCG inhalation capsule Place 1 capsule (18 mcg total) into inhaler and inhale daily.  . [DISCONTINUED] amphetamine-dextroamphetamine (ADDERALL) 10 MG tablet Take 10 mg by mouth 2 (two) times daily with a meal.  . [DISCONTINUED] chlorpheniramine-HYDROcodone (TUSSIONEX PENNKINETIC ER) 10-8 MG/5ML SUER Take 5 mLs by mouth every 12 (twelve) hours as needed for cough.  . [DISCONTINUED] predniSONE (STERAPRED UNI-PAK 21 TAB) 10 MG (21)  TBPK tablet Take by mouth daily. Take 6 tabs by mouth daily  for 2 days, then 5 tabs for 2 days, then 4 tabs for 2 days, then 3 tabs for 2 days, 2 tabs for 2 days, then 1 tab by mouth daily for 2 days         Review of Systems     Objective:   Physical Exam     amb wm nad looks older than stated age / nad   Wt Readings from Last 3 Encounters:  01/17/17 130 lb 3.2 oz (59.1 kg)  12/11/16 135 lb (61.2 kg)  10/17/16 147 lb (66.7 kg)    Vital signs reviewed - Note  on arrival 02 sats  94% on RA     HEENT: nl dentition,   and oropharynx.   external ear canal  without cough reflex and clear  on R/ L wax impacted/ moderate bilateral non-specific turbinate edema     NECK :  without JVD/Nodes/TM/ nl carotid upstrokes bilaterally   LUNGS: no acc muscle use,  Nl contour chest with minimal late exp rhonchi bilaterally and cough on fvc maneuver   CV:  RRR  no s3 or murmur or increase in P2, and no edema   ABD:  soft and nontender with nl inspiratory excursion in the supine position. No bruits or organomegaly appreciated, bowel sounds nl  MS:  Nl gait/ ext warm without deformities, calf tenderness, cyanosis or clubbing No obvious joint restrictions   SKIN: warm and dry without lesions    NEURO:  alert, approp, nl sensorium with  no motor or cerebellar deficits apparent.      I personally reviewed images and agree with radiology impression as follows:  CXR:   12/11/16 No active cardiopulmonary disease.   Labs ordered 01/17/2017     Allergy profile/ alpha one testing    Assessment:

## 2017-01-17 NOTE — Patient Instructions (Signed)
Plan A = Automatic = stop advair/ spiriva and take symbicort 160 Take 2 puffs first thing in am and then another 2 puffs about 12 hours later.     Work on inhaler technique:  relax and gently blow all the way out then take a nice smooth deep breath back in, triggering the inhaler at same time you start breathing in.  Hold for up to 5 seconds if you can. Blow out thru nose. Rinse and gargle with water when done      Plan B = Backup Only use your albuterol as a rescue medication to be used if you can't catch your breath by resting or doing a relaxed purse lip breathing pattern.  - The less you use it, the better it will work when you need it. - Ok to use the inhaler up to 2 puffs  every 4 hours if you must but call for appointment if use goes up over your usual need - Don't leave home without it !!  (think of it like the spare tire for your car)   Plan C = Crisis - only use your albuterol nebulizer if you first try Plan B and it fails to help > ok to use the nebulizer up to every 4 hours but if start needing it regularly call for immediate appointment   Please remember to go to the lab department downstairs in the basement  for your tests - we will call you with the results when they are available.      Please schedule a follow up office visit in 6 weeks, call sooner if needed with pfts on return without am symbicort

## 2017-01-17 NOTE — Assessment & Plan Note (Addendum)
Spirometry 01/17/2017  FEV1 3.47 (96%)  Ratio 70  With mild / mod curvature and < 6 sec exp - Allergy profile 01/17/2017 >  Eos 0. /  IgE   - Alpha one AT screening sent - 01/17/2017  After extensive coaching HFA effectiveness =    75% > try off dpi's and use symb 160 2bid  DDX of  difficult airways management almost all start with A and  include Adherence, Ace Inhibitors, Acid Reflux, Active Sinus Disease, Alpha 1 Antitripsin deficiency, Anxiety masquerading as Airways dz,  ABPA,  Allergy(esp in young), Aspiration (esp in elderly), Adverse effects of meds,  Active smokers, A bunch of PE's (a small clot burden can't cause this syndrome unless there is already severe underlying pulm or vascular dz with poor reserve) plus two Bs  = Bronchiectasis and Beta blocker use..and one C= CHF   Adherence is always the initial "prime suspect" and is a multilayered concern that requires a "trust but verify" approach in every patient - starting with knowing how to use medications, especially inhalers, correctly, keeping up with refills and understanding the fundamental difference between maintenance and prns vs those medications only taken for a very short course and then stopped and not refilled.  - admits non-adherent - see hfa teaching above   Active smoking > see sep a/p   ? Alpha one at def > send screening  ? Allergy/ Asthma > note marked reversibility with pred c/w asthmatic component ? Allergy based   ? Active sinus dz > consider sinus CT later   ? Anxiety > usually at the bottom of this list of usual suspects but should be much higher on this pt's based on H and P and note already on psychotropics/ may play a role in adherence and interfere with efforts to stop smoking   ? Adverse effect of DPI's > try off    Total time devoted to counseling  > 50 % of initial 60 min office visit:  review case with pt/ discussion of options/alternatives/ personally creating written customized instructions  in  presence of pt  then going over those specific  Instructions directly with the pt including how to use all of the meds but in particular covering each new medication in detail and the difference between the maintenance= "automatic" meds and the prns using an action plan format for the latter (If this problem/symptom => do that organization reading Left to right).  Please see AVS from this visit for a full list of these instructions which I personally wrote for this pt and  are unique to this visit.

## 2017-01-17 NOTE — Assessment & Plan Note (Signed)
>   3 min Discussed the risks and costs (both direct and indirect)  of smoking relative to the benefits of quitting but patient unwilling to commit at this point to a specific quit date.    Although I don't endorse regular use of e cigs/ many pts find them helpful; however, I emphasized they should be considered a "one-way bridge" off all tobacco products.  

## 2017-01-19 ENCOUNTER — Emergency Department (HOSPITAL_COMMUNITY)
Admission: EM | Admit: 2017-01-19 | Discharge: 2017-01-19 | Disposition: A | Discharge status: Other Acute Inpatient Hospital | Payer: Commercial Managed Care - PPO | Attending: Emergency Medicine | Admitting: Emergency Medicine

## 2017-01-19 ENCOUNTER — Encounter (HOSPITAL_COMMUNITY): Payer: Self-pay | Admitting: *Deleted

## 2017-01-19 ENCOUNTER — Other Ambulatory Visit: Payer: Self-pay

## 2017-01-19 ENCOUNTER — Inpatient Hospital Stay (HOSPITAL_COMMUNITY)
Admission: AD | Admit: 2017-01-19 | Discharge: 2017-01-24 | DRG: 881 | Disposition: A | Discharge status: Home / Self Care | Payer: Commercial Managed Care - PPO | Source: Intra-hospital | Attending: Psychiatry | Admitting: Psychiatry

## 2017-01-19 ENCOUNTER — Encounter (HOSPITAL_COMMUNITY): Payer: Self-pay | Admitting: Behavioral Health

## 2017-01-19 DIAGNOSIS — J449 Chronic obstructive pulmonary disease, unspecified: Secondary | ICD-10-CM | POA: Diagnosis present

## 2017-01-19 DIAGNOSIS — R45851 Suicidal ideations: Secondary | ICD-10-CM | POA: Diagnosis not present

## 2017-01-19 DIAGNOSIS — Z23 Encounter for immunization: Secondary | ICD-10-CM | POA: Diagnosis not present

## 2017-01-19 DIAGNOSIS — W2203XA Walked into furniture, initial encounter: Secondary | ICD-10-CM | POA: Diagnosis present

## 2017-01-19 DIAGNOSIS — Z8349 Family history of other endocrine, nutritional and metabolic diseases: Secondary | ICD-10-CM

## 2017-01-19 DIAGNOSIS — Z79899 Other long term (current) drug therapy: Secondary | ICD-10-CM | POA: Insufficient documentation

## 2017-01-19 DIAGNOSIS — F419 Anxiety disorder, unspecified: Secondary | ICD-10-CM | POA: Insufficient documentation

## 2017-01-19 DIAGNOSIS — S6990XA Unspecified injury of unspecified wrist, hand and finger(s), initial encounter: Secondary | ICD-10-CM | POA: Diagnosis present

## 2017-01-19 DIAGNOSIS — F411 Generalized anxiety disorder: Secondary | ICD-10-CM | POA: Diagnosis present

## 2017-01-19 DIAGNOSIS — Z7951 Long term (current) use of inhaled steroids: Secondary | ICD-10-CM

## 2017-01-19 DIAGNOSIS — B009 Herpesviral infection, unspecified: Secondary | ICD-10-CM | POA: Diagnosis present

## 2017-01-19 DIAGNOSIS — F17210 Nicotine dependence, cigarettes, uncomplicated: Secondary | ICD-10-CM | POA: Diagnosis present

## 2017-01-19 DIAGNOSIS — F410 Panic disorder [episodic paroxysmal anxiety] without agoraphobia: Secondary | ICD-10-CM | POA: Diagnosis present

## 2017-01-19 DIAGNOSIS — Z818 Family history of other mental and behavioral disorders: Secondary | ICD-10-CM | POA: Diagnosis not present

## 2017-01-19 DIAGNOSIS — F4000 Agoraphobia, unspecified: Secondary | ICD-10-CM | POA: Diagnosis present

## 2017-01-19 DIAGNOSIS — F329 Major depressive disorder, single episode, unspecified: Secondary | ICD-10-CM | POA: Diagnosis not present

## 2017-01-19 DIAGNOSIS — S50812A Abrasion of left forearm, initial encounter: Secondary | ICD-10-CM | POA: Insufficient documentation

## 2017-01-19 DIAGNOSIS — G4700 Insomnia, unspecified: Secondary | ICD-10-CM | POA: Diagnosis present

## 2017-01-19 DIAGNOSIS — M25519 Pain in unspecified shoulder: Secondary | ICD-10-CM | POA: Diagnosis present

## 2017-01-19 DIAGNOSIS — Y929 Unspecified place or not applicable: Secondary | ICD-10-CM | POA: Diagnosis not present

## 2017-01-19 DIAGNOSIS — F909 Attention-deficit hyperactivity disorder, unspecified type: Secondary | ICD-10-CM | POA: Diagnosis not present

## 2017-01-19 DIAGNOSIS — T148XXA Other injury of unspecified body region, initial encounter: Secondary | ICD-10-CM

## 2017-01-19 DIAGNOSIS — M79631 Pain in right forearm: Secondary | ICD-10-CM

## 2017-01-19 DIAGNOSIS — G8929 Other chronic pain: Secondary | ICD-10-CM | POA: Diagnosis present

## 2017-01-19 DIAGNOSIS — Y92230 Patient room in hospital as the place of occurrence of the external cause: Secondary | ICD-10-CM | POA: Diagnosis present

## 2017-01-19 DIAGNOSIS — F1994 Other psychoactive substance use, unspecified with psychoactive substance-induced mood disorder: Secondary | ICD-10-CM | POA: Diagnosis not present

## 2017-01-19 DIAGNOSIS — Y9389 Activity, other specified: Secondary | ICD-10-CM | POA: Insufficient documentation

## 2017-01-19 DIAGNOSIS — Y998 Other external cause status: Secondary | ICD-10-CM | POA: Diagnosis not present

## 2017-01-19 DIAGNOSIS — F4001 Agoraphobia with panic disorder: Secondary | ICD-10-CM | POA: Diagnosis not present

## 2017-01-19 DIAGNOSIS — F4310 Post-traumatic stress disorder, unspecified: Secondary | ICD-10-CM | POA: Diagnosis not present

## 2017-01-19 DIAGNOSIS — F39 Unspecified mood [affective] disorder: Secondary | ICD-10-CM | POA: Diagnosis not present

## 2017-01-19 DIAGNOSIS — F1210 Cannabis abuse, uncomplicated: Secondary | ICD-10-CM | POA: Diagnosis not present

## 2017-01-19 DIAGNOSIS — X781XXA Intentional self-harm by knife, initial encounter: Secondary | ICD-10-CM | POA: Diagnosis not present

## 2017-01-19 DIAGNOSIS — F332 Major depressive disorder, recurrent severe without psychotic features: Secondary | ICD-10-CM | POA: Diagnosis not present

## 2017-01-19 DIAGNOSIS — R450 Nervousness: Secondary | ICD-10-CM | POA: Diagnosis not present

## 2017-01-19 LAB — CBC
HCT: 43.4 % (ref 39.0–52.0)
HEMOGLOBIN: 14.9 g/dL (ref 13.0–17.0)
MCH: 31.8 pg (ref 26.0–34.0)
MCHC: 34.3 g/dL (ref 30.0–36.0)
MCV: 92.5 fL (ref 78.0–100.0)
Platelets: 239 10*3/uL (ref 150–400)
RBC: 4.69 MIL/uL (ref 4.22–5.81)
RDW: 13.3 % (ref 11.5–15.5)
WBC: 7.9 10*3/uL (ref 4.0–10.5)

## 2017-01-19 LAB — COMPREHENSIVE METABOLIC PANEL
ALK PHOS: 48 U/L (ref 38–126)
ALT: 13 U/L — AB (ref 17–63)
ANION GAP: 7 (ref 5–15)
AST: 23 U/L (ref 15–41)
Albumin: 3.8 g/dL (ref 3.5–5.0)
BILIRUBIN TOTAL: 0.8 mg/dL (ref 0.3–1.2)
BUN: 9 mg/dL (ref 6–20)
CHLORIDE: 105 mmol/L (ref 101–111)
CO2: 26 mmol/L (ref 22–32)
Calcium: 9.3 mg/dL (ref 8.9–10.3)
Creatinine, Ser: 0.71 mg/dL (ref 0.61–1.24)
GLUCOSE: 84 mg/dL (ref 65–99)
Potassium: 4.3 mmol/L (ref 3.5–5.1)
SODIUM: 138 mmol/L (ref 135–145)
Total Protein: 6.6 g/dL (ref 6.5–8.1)

## 2017-01-19 LAB — ETHANOL: Alcohol, Ethyl (B): <10 mg/dL (ref <10)

## 2017-01-19 LAB — RAPID URINE DRUG SCREEN, HOSP PERFORMED
AMPHETAMINES: NOT DETECTED
BENZODIAZEPINES: POSITIVE — AB
Barbiturates: NOT DETECTED
Cocaine: NOT DETECTED
OPIATES: NOT DETECTED
Tetrahydrocannabinol: POSITIVE — AB

## 2017-01-19 LAB — ACETAMINOPHEN LEVEL: Acetaminophen (Tylenol), Serum: <10 ug/mL — ABNORMAL LOW (ref 10–30)

## 2017-01-19 LAB — SALICYLATE LEVEL: Salicylate Lvl: <7 mg/dL (ref 2.8–30.0)

## 2017-01-19 LAB — LITHIUM LEVEL: LITHIUM LVL: 0.1 mmol/L — AB (ref 0.60–1.20)

## 2017-01-19 MED ORDER — MOMETASONE FURO-FORMOTEROL FUM 200-5 MCG/ACT IN AERO
2.0000 | INHALATION_SPRAY | Freq: Two times a day (BID) | RESPIRATORY_TRACT | Status: DC
  Administered 2017-01-19: 2 via RESPIRATORY_TRACT
  Filled 2017-01-19: qty 8.8

## 2017-01-19 MED ORDER — VENLAFAXINE HCL 75 MG PO TABS
75.0000 mg | ORAL_TABLET | Freq: Every day | ORAL | Status: DC
  Filled 2017-01-19: qty 1

## 2017-01-19 MED ORDER — VENLAFAXINE HCL ER 75 MG PO CP24
75.0000 mg | ORAL_CAPSULE | Freq: Every day | ORAL | Status: DC
  Administered 2017-01-20: 75 mg via ORAL
  Filled 2017-01-19 (×4): qty 1

## 2017-01-19 MED ORDER — HYDROXYZINE HCL 25 MG PO TABS
25.0000 mg | ORAL_TABLET | Freq: Three times a day (TID) | ORAL | Status: DC | PRN
  Administered 2017-01-20: 25 mg via ORAL
  Filled 2017-01-19: qty 1

## 2017-01-19 MED ORDER — MOMETASONE FURO-FORMOTEROL FUM 200-5 MCG/ACT IN AERO
2.0000 | INHALATION_SPRAY | Freq: Two times a day (BID) | RESPIRATORY_TRACT | Status: DC
  Administered 2017-01-20 – 2017-01-24 (×9): 2 via RESPIRATORY_TRACT
  Filled 2017-01-19 (×2): qty 8.8

## 2017-01-19 MED ORDER — TRAZODONE HCL 50 MG PO TABS
50.0000 mg | ORAL_TABLET | Freq: Every evening | ORAL | Status: DC | PRN
  Administered 2017-01-19 – 2017-01-23 (×5): 50 mg via ORAL
  Filled 2017-01-19 (×6): qty 1

## 2017-01-19 MED ORDER — LITHIUM CARBONATE 300 MG PO CAPS
300.0000 mg | ORAL_CAPSULE | Freq: Every day | ORAL | Status: DC
  Administered 2017-01-19: 300 mg via ORAL
  Filled 2017-01-19: qty 1

## 2017-01-19 MED ORDER — PNEUMOCOCCAL VAC POLYVALENT 25 MCG/0.5ML IJ INJ
0.5000 mL | INJECTION | INTRAMUSCULAR | Status: DC
Start: 2017-01-20 — End: 2017-01-22

## 2017-01-19 MED ORDER — LITHIUM CARBONATE 300 MG PO CAPS
300.0000 mg | ORAL_CAPSULE | Freq: Every day | ORAL | Status: DC
  Filled 2017-01-19 (×4): qty 1

## 2017-01-19 MED ORDER — ACETAMINOPHEN 325 MG PO TABS: 650.0000 mg | ORAL_TABLET | Freq: Four times a day (QID) | ORAL | Status: DC | PRN

## 2017-01-19 MED ORDER — VALACYCLOVIR HCL 500 MG PO TABS
500.0000 mg | ORAL_TABLET | Freq: Two times a day (BID) | ORAL | Status: DC
  Administered 2017-01-19: 500 mg via ORAL
  Filled 2017-01-19: qty 1

## 2017-01-19 MED ORDER — ALUM & MAG HYDROXIDE-SIMETH 200-200-20 MG/5ML PO SUSP: 30.0000 mL | ORAL | Status: DC | PRN

## 2017-01-19 MED ORDER — BUSPIRONE HCL 15 MG PO TABS
15.0000 mg | ORAL_TABLET | Freq: Three times a day (TID) | ORAL | Status: DC
  Administered 2017-01-20 (×2): 15 mg via ORAL
  Filled 2017-01-19 (×8): qty 1

## 2017-01-19 MED ORDER — MAGNESIUM HYDROXIDE 400 MG/5ML PO SUSP: 30.0000 mL | Freq: Every day | ORAL | Status: DC | PRN

## 2017-01-19 MED ORDER — VALACYCLOVIR HCL 500 MG PO TABS
500.0000 mg | ORAL_TABLET | Freq: Two times a day (BID) | ORAL | Status: AC
  Administered 2017-01-20 – 2017-01-21 (×4): 500 mg via ORAL
  Filled 2017-01-19 (×4): qty 1

## 2017-01-19 MED ORDER — NICOTINE 21 MG/24HR TD PT24
21.0000 mg | MEDICATED_PATCH | Freq: Every day | TRANSDERMAL | Status: DC
  Administered 2017-01-19: 21 mg via TRANSDERMAL
  Filled 2017-01-19: qty 1

## 2017-01-19 MED ORDER — NICOTINE 21 MG/24HR TD PT24
21.0000 mg | MEDICATED_PATCH | Freq: Every day | TRANSDERMAL | Status: DC
  Administered 2017-01-20 – 2017-01-24 (×5): 21 mg via TRANSDERMAL
  Filled 2017-01-19 (×9): qty 1

## 2017-01-19 MED ORDER — ALPRAZOLAM 0.5 MG PO TABS
0.5000 mg | ORAL_TABLET | Freq: Two times a day (BID) | ORAL | Status: DC | PRN
  Administered 2017-01-19: 0.5 mg via ORAL
  Filled 2017-01-19: qty 1

## 2017-01-19 MED ORDER — LORAZEPAM 1 MG PO TABS
1.0000 mg | ORAL_TABLET | Freq: Once | ORAL | Status: AC
  Administered 2017-01-19: 1 mg via ORAL
  Filled 2017-01-19: qty 1

## 2017-01-19 MED ORDER — BUSPIRONE HCL 10 MG PO TABS
15.0000 mg | ORAL_TABLET | Freq: Three times a day (TID) | ORAL | Status: DC
  Administered 2017-01-19: 15 mg via ORAL
  Administered 2017-01-19: 17:00:00 via ORAL
  Filled 2017-01-19 (×2): qty 2

## 2017-01-19 NOTE — BH Assessment (Signed)
[facility] Assessment Progress Note    Per Nanine MeansJamison Lord, FNP, patient is recommended for psych inpatient and [facility]1 on 01/20/17 at 12:00.

## 2017-01-19 NOTE — ED Notes (Signed)
Bed: WA31 Expected date:  Expected time:  Means of arrival:  Comments: 

## 2017-01-19 NOTE — Progress Notes (Signed)
Dustin RuizJohn is a 41 year old male pt admitted on voluntary basis. On admission, Dustin Steele denies SI and is able to contract for safety while in the hospital. He reports that his biggest problem is anxiety and spoke about how his medications are being adjusted by his psychiatrist but reports nothing seems to be helping. He does report that he used to misuse his adderall that was prescribed and reports that he uses THC to help with his anxiety issues. He denies any alcohol abuse and reports that he does not use any other substances. He reports that he did make some self inflicted lacerations to left arm and did so to help ease his anxiety. He reports that he lives with his wife and reports that he will return there after discharge. Dustin Steele was oriented to the unit and safety maintained.

## 2017-01-19 NOTE — ED Notes (Signed)
Pt oriented to room and unit. Pt is pleasant and cooperative.  Pt does appear anxious as he is constantly moving his hands.  Pt request nicotine patch to help with his nerves.  Pt denies S/I, H/I, and AVH   15 minute checks and video monitoring in place.

## 2017-01-19 NOTE — Progress Notes (Signed)
Report received from admitting RN.  Met with pt 1:1.  Pt denies SI/HI, hallucinations, and pain.  PRN medication administered for sleep.  He is safe on the unit and he verbally contracts for safety.  Will continue to monitor and assess.

## 2017-01-19 NOTE — ED Notes (Signed)
Pt. C/o anxiety. 

## 2017-01-19 NOTE — ED Triage Notes (Signed)
Pt states that he has changed meds multiple times and they seem to be doing the opposite. This morning he superficially cut his arm and said he just wanted to feel pain. [facility] tomorrow and to come through our ED for immediate clearance. Pt wife at bedside. Claims he needs something to calm down and needs his symbicort inhaler.

## 2017-01-19 NOTE — Tx Team (Signed)
Initial Treatment Plan 01/19/2017 11:03 PM Dustin RuizJohn Alinda SierrasM Wussow ZOX:[id number]    PATIENT STRESSORS: Medication change or noncompliance Other: "bad luck"   PATIENT STRENGTHS: Ability for insight Average or above average intelligence Capable of independent living General fund of knowledge Motivation for treatment/growth Supportive family/friends   PATIENT IDENTIFIED PROBLEMS: Depression Anxiety "Anxiety issues for 21 years, that's my biggest issue" "I absorb a lot of negative energy"                     DISCHARGE CRITERIA:  Ability to meet basic life and health needs Improved stabilization in mood, thinking, and/or behavior Verbal commitment to aftercare and medication compliance  PRELIMINARY DISCHARGE PLAN: Attend aftercare/continuing care group Return to previous living arrangement  PATIENT/FAMILY INVOLVEMENT: This treatment plan has been presented to and reviewed with the patient, Ailene ArdsJohn M Horrigan, and/or family member, .  The patient and family have been given the opportunity to ask questions and make suggestions.  Ardean Melroy, [name], [place] 01/19/2017, 11:03 PM

## 2017-01-19 NOTE — ED Notes (Signed)
Report to include Situation, Background, Assessment, and Recommendations received from Edie RN. Patient alert and oriented, warm and dry, in no acute distress. Patient denies SI, HI, AVH and pain. Patient made aware of Q15 minute rounds and security cameras for their safety. Patient instructed to come to me with needs or concerns. 

## 2017-01-19 NOTE — BH Assessment (Addendum)
Assessment Note  Per ED Report: Dustin Steele is a 41 y.o. male  with a PMH of ADHD, anxiety, COPD who presents to the Emergency Department complaining of progressively worsening "psychotic break". Wife at bedside states that over the last 3-4 weeks, he has seemed quite out of sorts. He has been punching the walls, sleeping much more than usual and appeared very anxious. This has gotten worse each day. This morning, patient cut himself with a knife to the left arm. He states that he did this to "feel the pain", not in an effort to kill himself. He states that he would have cut himself deeper and with a sharper knife if he really wanted to hurt himself.  He has had several recent changes to medications. He was on Wellbutrin which was discontinued a few weeks ago and after a few days off this medication, he was transitioned to Venlafaxine. 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 Patient denies SI/HI. Wife at bedside is worried that his behavior lately may progress to suicide attempt. No auditory or visual hallucinations.   Patient states that he has been on a downward spiral for the past month.  He states that he sought help to get better, but had a bad reaction to his Wellbutrin.  He states that his psychiatrist changed his medication to Effexor and he was also taking Adderall, but states that he was taking more than prescribed by accident.  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  When he went home to his parent's house at Thanksgiving while on Adderall and they thought he was abusing methamphetamines again.  He states that he has also been experiencing uncontrollable crying spells at times. Patient  states that on an outpatient basis that his psychiatrist has not been able to get his medications tweaked and his appointments are generally three months apart and he states that he does not feel any better than he did prior to seeking help for himself.  Patient states that he has been smoking two pipes of marijuana daily for years with his last use being yesterday.  Patient states that "smoking marijuana is the least of my worries.     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   Past Medical History:  Past Medical History:  Diagnosis Date  . ADHD   . Anxiety   . 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 COPD (chronic obstructive pulmonary disease) (HCC)   . Nicotine dependence   . Panic disorder     Past Surgical History:  Procedure Laterality Date  . FEMUR FRACTURE SURGERY    . FEMUR IM ROD REMOVAL      Family History: No family history on file.  Social History:  reports that he has been smoking cigarettes.  He has a 15.00 pack-year smoking history. he has never used smokeless tobacco. He reports that he drinks about 1.2 oz of alcohol per week. He reports that he uses drugs. Drug: Marijuana.  Additional Social History:  Alcohol / Drug Use Pain Medications: denies Prescriptions: denies Over the Counter: denies History of alcohol / drug use?: Yes Longest period of sobriety (when/how long): none reported Substance #1 Name of  Substance 1: cannabis 1 - Age of First Use: 17 1 - Amount (size/oz): 2 pipes 1 - Frequency: daily 1 - Duration: years 1 - Last Use / Amount: yesterday  CIWA: CIWA-Ar BP: (!) 153/90 Pulse Rate: (!) 101 COWS:    Allergies: No Known Allergies  Home Medications:  (Not in a hospital admission)  OB/GYN Status:  No LMP for male patient.  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 : Yes Suicidal Intent: No Has patient had any suicidal intent within the past 6 months prior to admission? : No Is patient at risk for suicide?: Yes Suicidal Plan?: (patient cut arm he states to feel pain.) Has patient had any suicidal plan within the past 6 months prior to admission? : No Access to Means: No What has been your use of drugs/alcohol within the last 12 months?: (uses THC daily) Previous Attempts/Gestures: No(denies attempts, but has been head butting and punching wall) How many times?: 0 Other Self Harm Risks: (none reported) Triggers for Past Attempts: None known Intentional Self Injurious Behavior: Damaging Comment - Self Injurious Behavior: (cutting, banging head and punching walls) Family Suicide History: No(does have a family history of mental illness) Recent stressful life event(s): Other (Comment)(medication changes) Persecutory voices/beliefs?: No Depression: Yes Depression Symptoms: Despondent, Isolating, Loss of interest in usual pleasures, Feeling worthless/self pity Substance abuse history and/or treatment for substance abuse?: Yes(daily marijuana use) Suicide prevention information given to  non-admitted patients: Not applicable  Risk to Others within the past 6 months Homicidal Ideation: No Does patient have any lifetime risk of violence toward others beyond the six months prior to admission? : No Thoughts of Harm to Others: No Current Homicidal Intent: No Current Homicidal Plan: No Access to Homicidal Means: No History of harm to others?: No Assessment of Violence: None Noted Does patient have access to weapons?: No Criminal Charges Pending?: No Does patient have a court date: No Is patient on probation?: No  Psychosis Hallucinations: None noted Delusions: None noted  Mental Status Report Appearance/Hygiene: Unremarkable Eye Contact: Good Motor Activity: Agitation, Restlessness Speech: Logical/coherent Level of Consciousness: Alert Mood: Depressed, Anxious, Apathetic Affect: Flat Anxiety Level: Moderate Thought Processes: Coherent, Relevant Judgement: Impaired Orientation: Person, Place, Time, Situation Obsessive Compulsive Thoughts/Behaviors: None  Cognitive Functioning Concentration: Normal Memory: Recent Intact, Remote Intact IQ: Average Insight: Good Impulse Control: Good Appetite: Poor Sleep: Decreased Total Hours of Sleep: 3 Vegetative Symptoms: None  ADLScreening [facility]) Patient's cognitive ability adequate to safely complete daily activities?: Yes Patient able to express need for assistance with ADLs?: Yes Independently performs ADLs?: Yes (appropriate for developmental age)  Prior Inpatient Therapy Prior Inpatient Therapy: No Prior Therapy Dates: NA Prior Therapy Facilty/Provider(s): NA  Prior Outpatient  Therapy Prior Outpatient Therapy: Yes Prior Therapy Dates: (Active Client at [facility]) Prior Therapy Facilty/Provider(s): ([facility]) Reason for Treatment: (depression and anxiety) Does patient have an ACCT team?: No Does patient have Intensive In-House Services?  : No Does patient  have [facility] services? : No Does patient have P4CC services?: No  ADL Screening (condition at time of admission) Patient's cognitive ability adequate to safely complete daily activities?: Yes Is the patient deaf or have difficulty hearing?: No Does the patient have difficulty seeing, even when wearing glasses/contacts?: No Does the patient have difficulty concentrating, remembering, or making decisions?: No Patient able to express need for assistance with ADLs?: Yes Does the patient have difficulty dressing or bathing?: No Independently performs ADLs?: Yes (appropriate for developmental age) Does the patient have difficulty walking or climbing stairs?: No Weakness of Legs: None Weakness of Arms/Hands: None       Abuse/Neglect Assessment (Assessment to be complete while patient is alone) Abuse/Neglect Assessment Can Be Completed: Yes Physical Abuse: Denies Verbal Abuse: Denies Sexual Abuse: Denies Exploitation of patient/patient's resources: Denies Self-Neglect: Denies Values / Beliefs Cultural Requests During Hospitalization: None Spiritual Requests During Hospitalization: None Consults Spiritual Care Consult Needed: No Social Work Consult Needed: No Merchant navy officerAdvance Directives (For Healthcare) Does Patient Have a Medical Advance Directive?: No Would patient like information on creating a medical advance directive?: No - Patient declined    Additional Information 1:1 In Past 12 Months?: No CIRT Risk: No Elopement Risk: No Does patient have medical clearance?: No     Disposition: 01/19/17 Per Nanine MeansJamison Lord, FNP, patient is recommended for psych inpatient and [facility] at 12:00.   Disposition Initial Assessment Completed for this Encounter: Yes Disposition of Patient: Inpatient treatment program Type of inpatient treatment program: Adult  On Site Evaluation by:   Reviewed with Physician:    Arnoldo LenisDanny J Kristelle Cavallaro 01/19/2017 5:34 PM

## 2017-01-19 NOTE — ED Provider Notes (Signed)
[facility]-EMERGENCY DEPT Provider Note   CSN: [id number] Arrival date & time: 01/19/17  1439     History   Chief Complaint Chief Complaint  Patient presents with  . Suicidal    HPI Dustin Steele is a 41 y.o. male.  The history is provided by the patient and medical records. No language interpreter was used.   Dustin Steele is a 41 y.o. male  with a PMH of ADHD, anxiety, COPD who presents to the Emergency Department complaining of progressively worsening "psychotic break". Wife at bedside states that over the last 3-4 weeks, he has seemed quite out of sorts. He has been punching the walls, sleeping much more than usual and appeared very anxious. This has gotten worse each day. This morning, patient cut himself with a knife to the left arm. He states that he did this to "feel the pain", not in an effort to kill himself. He states that he would have cut himself deeper and with a sharper knife if he really wanted to hurt himself. Tdap was updated in 2012, therefore up-to-date. He has had several recent changes to medications. He was on Wellbutrin which was discontinued a few weeks ago and after a few days off this medication, he was transitioned to Venlafaxine. He was also taken off of his Adderall, per wife, this is because he was taking more than the recommended dose, so psychiatrist removed medication off of his med list due to concerns of abuse. Patient denies SI/HI. Wife at bedside is worried that his behavior lately may progress to suicide attempt. No auditory or visual hallucinations.    Past Medical History:  Diagnosis Date  . ADHD   . Anxiety   . 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 COPD (chronic obstructive pulmonary disease) (HCC)   . Nicotine dependence   . Panic disorder     Patient Active Problem List   Diagnosis Date Noted  . Cigarette smoker 01/17/2017  . Abnormal PFT 09/23/2016  . COPD GOLD 0/1 still smoking   . Nicotine dependence   . Anxiety   .  ADHD   . Panic disorder     Past Surgical History:  Procedure Laterality Date  . FEMUR FRACTURE SURGERY    . FEMUR IM ROD REMOVAL         Home Medications    Prior to Admission medications   Medication Sig Start Date End Date Taking? Authorizing Provider  albuterol (PROVENTIL HFA;VENTOLIN HFA) 108 (90 Base) MCG/ACT inhaler Inhale 2 puffs into the lungs every 6 (six) hours as needed for wheezing or shortness of breath.   Yes [provider]  albuterol (PROVENTIL) (2.5 MG/3ML) 0.083% nebulizer solution Take 3 mLs (2.5 mg total) by nebulization every 6 (six) hours as needed for wheezing or shortness of breath. 10/17/16  Yes Zadie Rhine, MD  ALPRAZolam Prudy Feeler) 0.5 MG tablet Take 0.5 mg by mouth 2 (two) times daily as needed for anxiety.   Yes [provider]  budesonide-formoterol (SYMBICORT) 160-4.5 MCG/ACT inhaler Inhale 2 puffs into the lungs 2 (two) times daily. 01/17/17  Yes Nyoka Cowden, MD  busPIRone (BUSPAR) 15 MG tablet Take 15 mg by mouth 3 (three) times daily.   Yes [provider]  lithium carbonate 300 MG capsule Take 300 mg by mouth daily.   Yes [provider]  valACYclovir (VALTREX) 500 MG tablet Take 500 mg by mouth 2 (two) times daily.   Yes [provider]  venlafaxine [facility])  37.5 MG tablet Take 75 mg by mouth daily.   Yes [provider]    Family History No family history on file.  Social History Social History   Tobacco Use  . Smoking status: Current Every Day Smoker    Packs/day: 0.75    Years: 20.00    Pack years: 15.00    Types: Cigarettes  . Smokeless tobacco: Never Used  Substance Use Topics  . Alcohol use: Yes    Alcohol/week: 1.2 oz    Types: 2 Cans of beer per week    Comment: social  . Drug use: Yes    Types: Marijuana     Allergies   Patient has no known allergies.   Review of Systems Review of Systems  Psychiatric/Behavioral: Positive for agitation and self-injury. The  patient is nervous/anxious.   All other systems reviewed and are negative.    Physical Exam Updated Vital Signs BP (!) 153/90 (BP Location: Right Arm)   Pulse (!) 101   Temp 98 F (36.7 C) (Oral)   Resp 20   SpO2 100%   Physical Exam  Constitutional: He is oriented to person, place, and time. He appears well-developed and well-nourished. No distress.  HENT:  Head: Normocephalic and atraumatic.  Cardiovascular: Normal rate, regular rhythm and normal heart sounds.  No murmur heard. Pulmonary/Chest: Effort normal and breath sounds normal. No respiratory distress.  Abdominal: Soft. He exhibits no distension. There is no tenderness.  Musculoskeletal: He exhibits no edema.  Neurological: He is alert and oriented to person, place, and time.  Skin: Skin is warm and dry.  Three superficial abrasions to the left forearm.   Nursing note and vitals reviewed.    ED Treatments / Results  Labs (all labs ordered are listed, but only abnormal results are displayed) Labs Reviewed  COMPREHENSIVE METABOLIC PANEL - Abnormal; Notable for the following components:      Result Value   ALT 13 (*)    All other components within normal limits  ACETAMINOPHEN LEVEL - Abnormal; Notable for the following components:   Acetaminophen (Tylenol), Serum <10 (*)    All other components within normal limits  RAPID URINE DRUG SCREEN, HOSP PERFORMED - Abnormal; Notable for the following components:   Benzodiazepines POSITIVE (*)    Tetrahydrocannabinol POSITIVE (*)    All other components within normal limits  LITHIUM LEVEL - Abnormal; Notable for the following components:   Lithium Lvl 0.10 (*)    All other components within normal limits  ETHANOL  SALICYLATE LEVEL  CBC    EKG  EKG Interpretation None       Radiology No results found.  Procedures Procedures (including critical care time)  Medications Ordered in ED Medications  ALPRAZolam (XANAX) tablet 0.5 mg (not administered)    mometasone-formoterol (DULERA) 200-5 MCG/ACT inhaler 2 puff (not administered)  busPIRone (BUSPAR) tablet 15 mg (not administered)  lithium carbonate capsule 300 mg (not administered)  valACYclovir (VALTREX) tablet 500 mg (not administered)  venlafaxine (EFFEXOR) tablet 75 mg (not administered)  nicotine (NICODERM CQ - dosed in mg/24 hours) patch 21 mg (not administered)  LORazepam (ATIVAN) tablet 1 mg (1 mg Oral Given 01/19/17 1603)     Initial Impression / Assessment and Plan / ED Course  I have reviewed the triage vital signs and the nursing notes.  Pertinent labs & imaging results that were available during my care of the patient were reviewed by me and considered in my medical decision making (see chart for details).  Ailene ArdsJohn M Lasure is a 41 y.o. male who presents to ED for suicidal thoughts and superficial abrasions to the left forearm after cutting himself today. Wounds cleaned in ED. Very superficial and not requiring repair. Tetanus is up-to-date. Labs reviewed and reassuring. Medically cleared with dispo per TTS recommendations. Holding orders and home meds placed.    Final Clinical Impressions(s) / ED Diagnoses   Final diagnoses:  Suicidal thoughts  Superficial abrasion    ED Discharge Orders    None       Izaiha Lo, Chase PicketJaime Pilcher, PA-C 01/19/17 1639    Loren RacerYelverton, David, MD 01/28/17 [phone]

## 2017-01-20 DIAGNOSIS — F410 Panic disorder [episodic paroxysmal anxiety] without agoraphobia: Secondary | ICD-10-CM

## 2017-01-20 DIAGNOSIS — F329 Major depressive disorder, single episode, unspecified: Principal | ICD-10-CM

## 2017-01-20 DIAGNOSIS — F17210 Nicotine dependence, cigarettes, uncomplicated: Secondary | ICD-10-CM

## 2017-01-20 DIAGNOSIS — Z818 Family history of other mental and behavioral disorders: Secondary | ICD-10-CM

## 2017-01-20 DIAGNOSIS — F419 Anxiety disorder, unspecified: Secondary | ICD-10-CM

## 2017-01-20 DIAGNOSIS — F4310 Post-traumatic stress disorder, unspecified: Secondary | ICD-10-CM

## 2017-01-20 DIAGNOSIS — F4001 Agoraphobia with panic disorder: Secondary | ICD-10-CM

## 2017-01-20 DIAGNOSIS — R450 Nervousness: Secondary | ICD-10-CM

## 2017-01-20 MED ORDER — CLONAZEPAM 0.5 MG PO TABS
0.5000 mg | ORAL_TABLET | Freq: Three times a day (TID) | ORAL | Status: DC
  Administered 2017-01-20 – 2017-01-22 (×5): 0.5 mg via ORAL
  Filled 2017-01-20 (×5): qty 1

## 2017-01-20 MED ORDER — VENLAFAXINE HCL ER 75 MG PO CP24
112.5000 mg | ORAL_CAPSULE | Freq: Every day | ORAL | Status: DC
  Administered 2017-01-21 – 2017-01-23 (×3): 112.5 mg via ORAL
  Filled 2017-01-20 (×6): qty 1
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
## 2017-01-20 NOTE — Progress Notes (Signed)
DAR NOTE: Patient presents with anxious affect and depressed mood. Pt complained of anxiety this morning. Pt stated his been taking xanax at home and that's the only medication that help when he is anxious. Pt observed in the room hyperventilating stating " I am having anxiety attack." Vistaril 25 mg given with some relief. Pt reports good sleep, good appetite, low energy, poor concentration. Denies pain, auditory and visual hallucinations.  Rates depression at 5, hopelessness at 3, and anxiety at 7.  Maintained on routine safety checks.  Medications given as prescribed.  Support and encouragement offered as needed. Will continue to monitor.

## 2017-01-20 NOTE — [facility] Suicide Risk Assessment (Signed)
[facility]   Nursing information obtained from:   patient and chart  Demographic factors:   41 year old  Married male, employed  Current Mental Status:   see below  Loss Factors:   recent relocation  Historical Factors:   panic disorder, anxiety, depression Risk Reduction Factors:   Resilience , social support, employed   Total Time spent with patient: 45 minutes Principal Problem:  Panic Disorder, MDD Diagnosis:   Patient Active Problem List   Diagnosis Date Noted  . MDD (major depressive disorder) [F32.9] 01/19/2017  . Cigarette smoker [F17.210] 01/17/2017  . Abnormal PFT [R94.2] 09/23/2016  . COPD GOLD 0/1 still smoking [J44.9]   . Nicotine dependence [F17.200]   . Anxiety [F41.9]   . ADHD [F90.9]   . Panic disorder [F41.0]     Continued Clinical Symptoms:  Alcohol Use Disorder Identification Test Final Score (AUDIT): 1 The "Alcohol Use Disorders Identification Test", Guidelines for Use in Primary Care, Second Edition.  World [employer] writer[employer] [facility](WHO). Score between 0-7:  no or low risk or alcohol related problems. Score between 8-15:  moderate risk of alcohol related problems. Score between 16-19:  high risk of alcohol related problems. Score 20 or above:  warrants further diagnostic evaluation for alcohol dependence and treatment.   CLINICAL FACTORS:  41 year old married male, presents to hospital voluntarily due to worsening anxiety ( panic attacks, agoraphobia), depression, recent episode of superficial self inflicted cut to forearm.    Psychiatric Specialty Exam: Physical Exam  ROS  Blood pressure 125/82, pulse (!) 109, temperature (!) 97.4 F (36.3 C), temperature source Oral, resp. rate 16, height 5\' 5"  (1.651 m), weight 60.3 kg (133 lb).Body mass index is 22.13 kg/m.   see admit note MSE    COGNITIVE FEATURES THAT CONTRIBUTE TO RISK:  Closed-mindedness and Loss of executive function    SUICIDE RISK:   Moderate:  Frequent  suicidal ideation with limited intensity, and duration, some specificity in terms of plans, no associated intent, good self-control, limited dysphoria/symptomatology, some risk factors present, and identifiable protective factors, including available and accessible social support.  PLAN OF CARE: Patient will be admitted to inpatient psychiatric unit for stabilization and safety. Will provide and encourage milieu participation. Provide medication management and maked adjustments as needed.  Will follow daily.    I certify that inpatient services furnished can reasonably be expected to improve the patient's condition.   Craige CottaFernando A Cobos, MD 01/20/2017, 4:13 PM

## 2017-01-20 NOTE — Tx Team (Signed)
Interdisciplinary Treatment and Diagnostic Plan Update  01/20/2017 Time of Session: 5:23 PM  Dustin Steele MRN: [id number]  Principal Diagnosis: <principal problem not specified>  Secondary Diagnoses: Active Problems:   MDD (major depressive disorder)   Current Medications:  Current Facility-Administered Medications  Medication Dose Route Frequency Provider Last Rate Last Dose  . acetaminophen (TYLENOL) tablet 650 mg  650 mg Oral Q6H PRN Okonkwo, Justina A, NP      . alum & mag hydroxide-simeth (MAALOX/MYLANTA) 200-200-20 MG/5ML suspension 30 mL  30 mL Oral Q4H PRN Okonkwo, Justina A, NP      . clonazePAM (KLONOPIN) tablet 0.5 mg  0.5 mg Oral TID Cobos, Fernando A, MD      . hydrOXYzine (ATARAX/VISTARIL) tablet 25 mg  25 mg Oral TID PRN Lu Duffel, Justina A, NP   25 mg at 01/20/17 0837  . magnesium hydroxide (MILK OF MAGNESIA) suspension 30 mL  30 mL Oral Daily PRN Okonkwo, Justina A, NP      . mometasone-formoterol (DULERA) 200-5 MCG/ACT inhaler 2 puff  2 puff Inhalation BID Okonkwo, Justina A, NP   2 puff at 01/20/17 0757  . nicotine (NICODERM CQ - dosed in mg/24 hours) patch 21 mg  21 mg Transdermal Daily Cobos, Myer Peer, MD   21 mg at 01/20/17 0757  . pneumococcal 23 valent vaccine (PNU-IMMUNE) injection 0.5 mL  0.5 mL Intramuscular Tomorrow-1000 Cobos, Fernando A, MD      . traZODone (DESYREL) tablet 50 mg  50 mg Oral QHS PRN Okonkwo, Justina A, NP   50 mg at 01/19/17 2309  . valACYclovir (VALTREX) tablet 500 mg  500 mg Oral BID Okonkwo, Justina A, NP   500 mg at 01/20/17 0757  . [START ON 01/21/2017] venlafaxine XR (EFFEXOR-XR) 24 hr capsule 112.5 mg  112.5 mg Oral Daily Cobos, Myer Peer, MD        PTA Medications: Medications Prior to Admission  Medication Sig Dispense Refill Last Dose  . albuterol (PROVENTIL HFA;VENTOLIN HFA) 108 (90 Base) MCG/ACT inhaler Inhale 2 puffs into the lungs every 6 (six) hours as needed for wheezing or shortness of breath.   Past Month at Unknown time   . albuterol (PROVENTIL) (2.5 MG/3ML) 0.083% nebulizer solution Take 3 mLs (2.5 mg total) by nebulization every 6 (six) hours as needed for wheezing or shortness of breath. 75 mL 12 Past Month at Unknown time  . ALPRAZolam (XANAX) 0.5 MG tablet Take 0.5 mg by mouth 2 (two) times daily as needed for anxiety.   01/19/2017 at Unknown time  . budesonide-formoterol (SYMBICORT) 160-4.5 MCG/ACT inhaler Inhale 2 puffs into the lungs 2 (two) times daily. 1 Inhaler 11 01/19/2017 at Unknown time  . busPIRone (BUSPAR) 15 MG tablet Take 15 mg by mouth 3 (three) times daily.   01/19/2017 at Unknown time  . lithium carbonate 300 MG capsule Take 300 mg by mouth daily.   01/18/2017 at Unknown time  . valACYclovir (VALTREX) 500 MG tablet Take 500 mg by mouth 2 (two) times daily.   01/19/2017 at Unknown time  . venlafaxine (EFFEXOR) 37.5 MG tablet Take 75 mg by mouth daily.   01/19/2017 at Unknown time    Patient Stressors: Medication change or noncompliance Other: "bad luck"  Patient Strengths: Ability for insight Average or above average intelligence Capable of independent living General fund of knowledge Motivation for treatment/growth Supportive family/friends  Treatment Modalities: Medication Management, Group therapy, Case management,  1 to 1 session with clinician, Psychoeducation, Recreational therapy.   Physician  Treatment Plan for Primary Diagnosis: <principal problem not specified> Long Term Goal(s): Improvement in symptoms so as ready for discharge  Short Term Goals: Ability to identify changes in lifestyle to reduce recurrence of condition will improve Ability to identify and develop effective coping behaviors will improve Ability to identify changes in lifestyle to reduce recurrence of condition will improve Ability to verbalize feelings will improve Ability to disclose and discuss suicidal ideas Ability to demonstrate self-control will improve Ability to identify and develop effective coping  behaviors will improve Ability to maintain clinical measurements within normal limits will improve  Medication Management: Evaluate patient's response, side effects, and tolerance of medication regimen.  Therapeutic Interventions: 1 to 1 sessions, Unit Group sessions and Medication administration.  Evaluation of Outcomes: Progressing  Physician Treatment Plan for Secondary Diagnosis: Active Problems:   MDD (major depressive disorder)   Long Term Goal(s): Improvement in symptoms so as ready for discharge  Short Term Goals: Ability to identify changes in lifestyle to reduce recurrence of condition will improve Ability to identify and develop effective coping behaviors will improve Ability to identify changes in lifestyle to reduce recurrence of condition will improve Ability to verbalize feelings will improve Ability to disclose and discuss suicidal ideas Ability to demonstrate self-control will improve Ability to identify and develop effective coping behaviors will improve Ability to maintain clinical measurements within normal limits will improve  Medication Management: Evaluate patient's response, side effects, and tolerance of medication regimen.  Therapeutic Interventions: 1 to 1 sessions, Unit Group sessions and Medication administration.  Evaluation of Outcomes: Progressing   RN Treatment Plan for Primary Diagnosis: <principal problem not specified> Long Term Goal(s): Knowledge of disease and therapeutic regimen to maintain health will improve  Short Term Goals: Ability to identify and develop effective coping behaviors will improve and Compliance with prescribed medications will improve  Medication Management: RN will administer medications as ordered by provider, will assess and evaluate patient's response and provide education to patient for prescribed medication. RN will report any adverse and/or side effects to prescribing provider.  Therapeutic Interventions: 1 on 1  counseling sessions, Psychoeducation, Medication administration, Evaluate responses to treatment, Monitor vital signs and CBGs as ordered, Perform/monitor CIWA, COWS, AIMS and Fall Risk screenings as ordered, Perform wound care treatments as ordered.  Evaluation of Outcomes: Progressing   LCSW Treatment Plan for Primary Diagnosis: <principal problem not specified> Long Term Goal(s): Safe transition to appropriate next level of care at discharge, Engage patient in therapeutic group addressing interpersonal concerns.  Short Term Goals: Engage patient in aftercare planning with referrals and resources  Therapeutic Interventions: Assess for all discharge needs, 1 to 1 time with Social worker, Explore available resources and support systems, Assess for adequacy in community support network, Educate family and significant other(s) on suicide prevention, Complete Psychosocial Assessment, Interpersonal group therapy.  Evaluation of Outcomes: Met  Return home, follow [facility]   Progress in Treatment: Attending groups: Yes Participating in groups: Yes Taking medication as prescribed: Yes Toleration medication: Yes, no side effects reported at this time Family/Significant other contact made: Yes Patient understands diagnosis: Yes AEB help with anxiety Discussing patient identified problems/goals with staff: Yes Medical problems stabilized or resolved: Yes Denies suicidal/homicidal ideation: Yes Issues/concerns per patient self-inventory: None Other: N/A  New problem(s) identified: None identified at this time.   New Short Term/Long Term Goal(s): "Anxiety issues for 21 years, that's my biggest issue"  Discharge Plan or Barriers:   Reason for Continuation of Hospitalization: Anxiety  Depression  Medication stabilization Suicidal ideation   Estimated Length of Stay: 12/7  Attendees: Patient: Dustin Steele 01/20/2017  5:23 PM  Physician: Marchelle Folks, MD 01/20/2017  5:23 PM   Nursing: Sena Hitch, RN 01/20/2017  5:23 PM  RN Care Manager: Lars Pinks, RN 01/20/2017  5:23 PM  Social Worker: Ripley Fraise 01/20/2017  5:23 PM  Recreational Therapist: Winfield Cunas 01/20/2017  5:23 PM  Other: Norberto Sorenson 01/20/2017  5:23 PM  Other:  01/20/2017  5:23 PM    Scribe for Treatment Team:  Roque Lias LCSW 01/20/2017 5:23 PM

## 2017-01-20 NOTE — [facility] Suicide Risk Assessment (Signed)
[facility] INPATIENT:  Family/Significant Other Suicide Prevention Education  Suicide Prevention Education:  Education Completed; Dustin Steele, wife, [phone]  has been identified by the patient as the family member/significant other with whom the patient will be residing, and identified as the person(s) who will aid the patient in the event of a mental health crisis (suicidal ideations/suicide attempt).  With written consent from the patient, the family member/significant other has been provided the following suicide prevention education, prior to the and/or following the discharge of the patient.  The suicide prevention education provided includes the following:  Suicide risk factors  Suicide prevention and interventions  National Suicide Hotline telephone number  [facility] assessment telephone number  [facility] Emergency Assistance 911  [facility] and/or Residential Mobile Crisis Unit telephone number  Request made of family/significant other to:  Remove weapons (e.g., guns, rifles, knives), all items previously/currently identified as safety concern.    Remove drugs/medications (over-the-counter, prescriptions, illicit drugs), all items previously/currently identified as a safety concern.  The family member/significant other verbalizes understanding of the suicide prevention education information provided.  The family member/significant other agrees to remove the items of safety concern listed above.  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  "We've been trying to get him help for a month and a half.  Wellbutrin had side effects-then he was abusing the adderal.  I believe this was a cry for help, and that he wanted to go to the hospital. We couldn't get in the week before because they said he wasn't a danger to himself."  Dustin Steele 01/20/2017, 5:08 PM

## 2017-01-20 NOTE — Progress Notes (Signed)
Recreation Therapy Notes  Date: 01/20/17 Time: 0930 Location: [address] Day[address]  Group Topic: Stress Management  Goal Area(s) Addresses:  Patient will verbalize importance of using healthy stress management.  Patient will identify positive emotions associated with healthy stress management.   Intervention: Stress Management  Activity :  Meditation.  LRT introduced the stress management technique of meditation.  LRT played a body scan meditation from the Calm app that allowed patients to take inventory of the tension or sensations they were feeling.  Education:  Stress Management, Discharge Planning.   Education Outcome: Acknowledges edcuation/In group clarification offered/Needs additional education  Clinical Observations/Feedback: Pt did not attend group.   Caroll RancherMarjette Vandora Jaskulski, LRT/CTRS         Caroll RancherLindsay, Jadore Mcguffin A 01/20/2017 12:43 PM

## 2017-01-21 DIAGNOSIS — F332 Major depressive disorder, recurrent severe without psychotic features: Secondary | ICD-10-CM

## 2017-01-21 DIAGNOSIS — F411 Generalized anxiety disorder: Secondary | ICD-10-CM

## 2017-01-21 DIAGNOSIS — F39 Unspecified mood [affective] disorder: Secondary | ICD-10-CM

## 2017-01-21 DIAGNOSIS — G4700 Insomnia, unspecified: Secondary | ICD-10-CM

## 2017-01-21 DIAGNOSIS — F1210 Cannabis abuse, uncomplicated: Secondary | ICD-10-CM

## 2017-01-21 LAB — TSH: TSH: 0.318 u[IU]/mL — ABNORMAL LOW (ref 0.350–4.500)

## 2017-01-21 MED ORDER — IBUPROFEN 600 MG PO TABS
600.0000 mg | ORAL_TABLET | Freq: Three times a day (TID) | ORAL | Status: DC | PRN
  Administered 2017-01-21 – 2017-01-24 (×4): 600 mg via ORAL
  Filled 2017-01-21: qty 1
  Filled 2017-01-21: qty 3
  Filled 2017-01-21 (×2): qty 1

## 2017-01-21 NOTE — Progress Notes (Signed)
Pt reports he is doing better this evening since the doctor started him on the Klonopin prn.  He asked when he could get another dose which Clinical research associatewriter informed him he could receive another at 2300.  He stated that he felt he would be ok just taking the Trazodone for sleep, then taking a Klonopin in the morning.  He has been pleasant and appropriate with staff this evening.  He denies SI/HI/AVH at this time.  He voices no other needs or concerns.  Support and encouragement offered.  Discharge plans are in process.  Safety maintained with q15 minute checks.

## 2017-01-21 NOTE — Progress Notes (Signed)
ATC, NA and no VM set up yet
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
## 2017-01-21 NOTE — Progress Notes (Signed)
Pt is fidgety and anxious on the unit. He is pleasant interacting with staff and peers. Pt rates depression as a 4 and anxiety as a 2 on 0-10 scale with 10 being the most. Pt says that he is feeling better since coming to the hospital.  A:Offered support,encouragement and 15 minute checks.   R:Pt denies si and hi. Safety maintained on the unit.

## 2017-01-21 NOTE — Plan of Care (Signed)
No self injurious behavior observed or expressed. 

## 2017-01-21 NOTE — Progress Notes (Signed)
Recreation Therapy Notes  Animal-Assisted Activity (AAA) Program Checklist/Progress Notes Patient Eligibility Criteria Checklist & Daily Group note for Rec TxIntervention  Date: 12.04.2018 Time: 2:45pm Location: [address]   AAA/T Program Assumption of Risk Form signed by Patient/ or Parent Legal Guardian Yes  Patient is free of allergies or sever asthma Yes  Patient reports no fear of animals Yes  Patient reports no history of cruelty to animals Yes  Patient understands his/her participation is voluntary Yes  Behavioral Response: Did not attend.   Marykay Lexenise L Lavi Sheehan, LRT/CTRS       Alain Deschene L 01/21/2017 3:02 PM

## 2017-01-21 NOTE — Progress Notes (Signed)
[facility] MD Progress Note  01/21/2017 1:44 PM Dustin Steele  MRN:  [id number]   Subjective:  Patient reports that he feels much better today "I feel more level." He reports improved appetite and sleep. He states that his energy is good energy and he denies ay depression or SI/HI/AVH. He doesn't feel anxious at this time either.   Objective: Patient's chart and findings reviewed and discussed with treatment team. Patient is very pleasant and cooperative. Will continue current medication regimen for now and continue to monitor for any agitation.  Principal Problem: MDD (major depressive disorder) Diagnosis:   Patient Active Problem List   Diagnosis Date Noted  . GAD (generalized anxiety disorder) [F41.1] 01/21/2017  . MDD (major depressive disorder) [F32.9] 01/19/2017  . Cigarette smoker [F17.210] 01/17/2017  . Abnormal PFT [R94.2] 09/23/2016  . COPD GOLD 0/1 still smoking [J44.9]   . Nicotine dependence [F17.200]   . Anxiety [F41.9]   . ADHD [F90.9]   . Panic disorder [F41.0]    Total Time spent with patient: 15 minutes  Past Psychiatric History: See H&P  Past Medical History:  Past Medical History:  Diagnosis Date  . ADHD   . Anxiety   . 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 COPD (chronic obstructive pulmonary disease) (HCC)   . Nicotine dependence   . Panic disorder     Past Surgical History:  Procedure Laterality Date  . FEMUR FRACTURE SURGERY    . FEMUR IM ROD REMOVAL     Family History: History reviewed. No pertinent family history. Family Psychiatric  History: See H&P Social History:  Social History   Substance and Sexual Activity  Alcohol Use Yes  . Alcohol/week: 1.2 oz  . Types: 2 Cans of beer per week   Comment: social     Social History   Substance and Sexual Activity  Drug Use Yes  . Types: Marijuana    Social History   Socioeconomic History  . Marital status: Married    Spouse name: None  . Number of children: None  . Years of education: None  . Highest  education level: None  Social Needs  . Financial resource strain: None  . Food insecurity - worry: None  . Food insecurity - inability: None  . Transportation needs - medical: None  . Transportation needs - non-medical: None  Occupational History  . None  Tobacco Use  . Smoking status: Current Every Day Smoker    Packs/day: 0.75    Years: 20.00    Pack years: 15.00    Types: Cigarettes  . Smokeless tobacco: Never Used  Substance and Sexual Activity  . Alcohol use: Yes    Alcohol/week: 1.2 oz    Types: 2 Cans of beer per week    Comment: social  . Drug use: Yes    Types: Marijuana  . Sexual activity: None  Other Topics Concern  . None  Social History Narrative  . None   Additional Social History:                         Sleep: Good  Appetite:  Good  Current Medications: Current Facility-Administered Medications  Medication Dose Route Frequency Provider Last Rate Last Dose  . acetaminophen (TYLENOL) tablet 650 mg  650 mg Oral Q6H PRN Okonkwo, Justina A, NP      . alum & mag hydroxide-simeth (MAALOX/MYLANTA) 200-200-20 MG/5ML suspension 30 mL  30 mL Oral Q4H PRN Okonkwo, Justina A, NP      .  clonazePAM (KLONOPIN) tablet 0.5 mg  0.5 mg Oral TID Cobos, Fernando A, MD   0.5 mg at 01/21/17 1206  . hydrOXYzine (ATARAX/VISTARIL) tablet 25 mg  25 mg Oral TID PRN Ferne Reuskonkwo, Justina A, NP   25 mg at 01/20/17 0837  . magnesium hydroxide (MILK OF MAGNESIA) suspension 30 mL  30 mL Oral Daily PRN Okonkwo, Justina A, NP      . mometasone-formoterol (DULERA) 200-5 MCG/ACT inhaler 2 puff  2 puff Inhalation BID Okonkwo, Justina A, NP   2 puff at 01/21/17 0818  . nicotine (NICODERM CQ - dosed in mg/24 hours) patch 21 mg  21 mg Transdermal Daily Cobos, Rockey SituFernando A, MD   21 mg at 01/21/17 0820  . pneumococcal 23 valent vaccine (PNU-IMMUNE) injection 0.5 mL  0.5 mL Intramuscular Tomorrow-1000 Cobos, Fernando A, MD      . traZODone (DESYREL) tablet 50 mg  50 mg Oral QHS PRN Okonkwo,  Justina A, NP   50 mg at 01/20/17 2125  . valACYclovir (VALTREX) tablet 500 mg  500 mg Oral BID Okonkwo, Justina A, NP   500 mg at 01/21/17 [id number]  . venlafaxine XR (EFFEXOR-XR) 24 hr capsule 112.5 mg  112.5 mg Oral Daily Cobos, Rockey SituFernando A, MD   112.5 mg at 01/21/17 [id number]    Lab Results:  Results for orders placed or performed during the hospital encounter of 01/19/17 (from the past 48 hour(s))  TSH     Status: Abnormal   Collection Time: 01/21/17  6:23 AM  Result Value Ref Range   TSH 0.318 (L) 0.350 - 4.500 uIU/mL    Comment: Performed by a 3rd Generation assay with a functional sensitivity of <=0.01 uIU/mL. Performed at [facility], [address] [place], [place] [address]     Blood Alcohol level:  Lab Results  Component Value Date   ETH <10 01/19/2017    Metabolic Disorder Labs: No results found for: HGBA1C, MPG No results found for: PROLACTIN No results found for: CHOL, TRIG, HDL, CHOLHDL, VLDL, LDLCALC  Physical Findings: AIMS: Facial and Oral Movements Muscles of Facial Expression: None, normal Lips and Perioral Area: None, normal Jaw: None, normal Tongue: None, normal,Extremity Movements Upper (arms, wrists, hands, fingers): None, normal Lower (legs, knees, ankles, toes): None, normal, Trunk Movements Neck, shoulders, hips: None, normal, Overall Severity Severity of abnormal movements (highest score from questions above): None, normal Incapacitation due to abnormal movements: None, normal Patient's awareness of abnormal movements (rate only patient's report): No Awareness, Dental Status Current problems with teeth and/or dentures?: No Does patient usually wear dentures?: No  CIWA:    COWS:     Musculoskeletal: Strength & Muscle Tone: within normal limits Gait & Station: normal Patient leans: N/A  Psychiatric Specialty Exam: Physical Exam  Nursing note and vitals reviewed. Constitutional: He is oriented to person, place, and time. He  appears well-developed and well-nourished.  Respiratory: Effort normal.  Musculoskeletal: Normal range of motion.  Neurological: He is alert and oriented to person, place, and time.  Skin: Skin is warm.    Review of Systems  Constitutional: Negative.   HENT: Negative.   Eyes: Negative.   Respiratory: Negative.   Cardiovascular: Negative.   Gastrointestinal: Negative.   Genitourinary: Negative.   Musculoskeletal: Negative.   Skin: Negative.   Neurological: Negative.   Endo/Heme/Allergies: Negative.   Psychiatric/Behavioral: Negative.     Blood pressure 111/88, pulse (!) 119, temperature 97.7 F (36.5 C), temperature source Oral, resp. rate 16, height 5\' 5"  (1.651  m), weight 60.3 kg (133 lb).Body mass index is 22.13 kg/m.  General Appearance: Casual  Eye Contact:  Good  Speech:  Clear and Coherent and Normal Rate  Volume:  Normal  Mood:  Euthymic  Affect:  Appropriate  Thought Process:  Goal Directed and Descriptions of Associations: Intact  Orientation:  Full (Time, Place, and Person)  Thought Content:  WDL  Suicidal Thoughts:  No  Homicidal Thoughts:  No  Memory:  Immediate;   Good Recent;   Good Remote;   Good  Judgement:  Good  Insight:  Good  Psychomotor Activity:  Normal  Concentration:  Concentration: Good and Attention Span: Good  Recall:  Good  Fund of Knowledge:  Good  Language:  Good  Akathisia:  No  Handed:  Right  AIMS (if indicated):     Assets:  Communication Skills Desire for Improvement Financial Resources/Insurance Housing Physical Health Social Support  ADL's:  Intact  Cognition:  WNL  Sleep:  Number of Hours: 6.5   Problems Addressed: MDD GAD  Treatment Plan Summary: Daily contact with patient to assess and evaluate symptoms and progress in treatment, Medication management and Plan is to:  -Continue Effexor-XR 112.5 mg PO Daily for mood stability -Continue Trazodone 50 mg PO QHS PRN for insomnia -Continue Klonopin 0.5 mg PO TID for  anxiety -Continue Vistaril 25 mg PO TID PRN for anxiety -Encourage group therapy  Dustin Bunnellravis B Jquan Egelston, FNP 01/21/2017, 1:44 PM

## 2017-01-21 NOTE — [facility] Group Notes (Signed)
Pt did not attend group. Pt stayed in bed.

## 2017-01-22 ENCOUNTER — Inpatient Hospital Stay (HOSPITAL_COMMUNITY): Payer: Commercial Managed Care - PPO

## 2017-01-22 ENCOUNTER — Encounter (HOSPITAL_COMMUNITY): Payer: Self-pay

## 2017-01-22 MED ORDER — PNEUMOCOCCAL VAC POLYVALENT 25 MCG/0.5ML IJ INJ
0.5000 mL | INJECTION | Freq: Once | INTRAMUSCULAR | Status: AC
  Administered 2017-01-22: 0.5 mL via INTRAMUSCULAR

## 2017-01-22 MED ORDER — HYDROCODONE-ACETAMINOPHEN 5-325 MG PO TABS
1.0000 | ORAL_TABLET | Freq: Once | ORAL | Status: AC
  Administered 2017-01-22: 1 via ORAL
  Filled 2017-01-22: qty 1

## 2017-01-22 MED ORDER — IBUPROFEN 800 MG PO TABS
800.0000 mg | ORAL_TABLET | Freq: Once | ORAL | Status: AC
  Administered 2017-01-22: 800 mg via ORAL
  Filled 2017-01-22: qty 1

## 2017-01-22 MED ORDER — HYDROXYZINE HCL 50 MG PO TABS
50.0000 mg | ORAL_TABLET | Freq: Three times a day (TID) | ORAL | Status: DC | PRN
  Administered 2017-01-22 (×2): 50 mg via ORAL
  Filled 2017-01-22 (×2): qty 1

## 2017-01-22 MED ORDER — CLONAZEPAM 0.5 MG PO TABS
0.5000 mg | ORAL_TABLET | Freq: Two times a day (BID) | ORAL | Status: DC
  Administered 2017-01-22 – 2017-01-23 (×2): 0.5 mg via ORAL
  Filled 2017-01-22 (×2): qty 1

## 2017-01-22 NOTE — Progress Notes (Signed)
Nurse had been sitting with patient while he laid on bed, holding his right arm/hand.  Patient stated he is going to let his wife sue the hospital and she will have money to enjoy after he is gone.  Patient laying on bed holding R arm/hand.  Charge nurse/NP informed.

## 2017-01-22 NOTE — Progress Notes (Signed)
Patient became upset because klonipin had been decreased from tid to bid.  Patient was offered vistaril 50 mg prn but patient declined, and then patient took vistaril 50 mg for anxiety.  Patient had become upset and hit his hand on mattress, and stated that his hand/arm slipped and he hit his R arm/hand on side on bed.  Patient informed that klonipin could be given to him after 4:00 p.m.  Patient continues to sit on bed, complaining that klonipin had been changed from tid to bid.   Charge nurse/nurse had been sitting with patient, encouraging patient to take vistaril 50 mg, and patient finally agreed. Patient declined his lunch and stayed in his room sitting on bed.

## 2017-01-22 NOTE — Progress Notes (Signed)
NP [facility] R [facility]/wrist evaluation.  Sitter will accompany patient to [facility].  [facility] and ED charge nurse informed.  Patient is now lying still in bed with no exaggerated movements.  Patient comfortable since he is being transported to ED.  Respirations even and unlabored.  No signs/symptoms of pain/distress noted on patient's face/body movements. Safety maintained with 15 minute checks.

## 2017-01-22 NOTE — Progress Notes (Signed)
Pt was upset at the beginning of the shift as he told his wife that he had been buying pain meds off the street and using them.  She was upset with him and they argued.  Pt said an MHT came in the room and tried to mediate uninvited, and that upset him even more.  Wife came by the NS to tell staff that he was upset.  By the time writer went to his room, he had calmed down and explained to Clinical research associatewriter what had happened.  He was able to process what had happened and did not require any med for being upset.  He asked writer about getting his sleep aid around 2100 to go to bed earlier.  Pt denies SI/HI/AVH.  He was irritable, but appropriate with Clinical research associatewriter.  Support and encouragement offered.  Discharge plans are in process.  Safety maintained with q15 minute checks.

## 2017-01-22 NOTE — Progress Notes (Signed)
D   Pt is pleasant on approach and cooperative   He did not attend group this evening and was sitting in the hallway   He was smiling when he started talking about how he hurt his hand and that he got vicodin for pain   His interactions with others is limited    Pt said he is looking forward to discharge tomorrow  A   Verbal support given   Medications administered and effectiveness monitored   Q 15 min checks  R   Pt is safe at present time

## 2017-01-22 NOTE — Plan of Care (Signed)
Nurse discussed anxiety/depression/anger/coping skills with patient.

## 2017-01-22 NOTE — Progress Notes (Signed)
Psychoeducational Group Note  Date:  01/22/2017 Time:  2323  Group Topic/Focus:  Wrap-Up Group:   The focus of this group is to help patients review their daily goal of treatment and discuss progress on daily workbooks.  Participation Level: Did Not Attend  Participation Quality:  Not Applicable  Affect:  Not Applicable  Cognitive:  Not Applicable  Insight:  Not Applicable  Engagement in Group: Not Applicable  Additional Comments:  The patient did not attend group but politely refused to do so. He states that he is more comfortable with speaking to the staff on a 1:1 basis.   Hazle CocaGOODMAN, Lylith Bebeau S 01/22/2017, 11:23 PM

## 2017-01-22 NOTE — Progress Notes (Signed)
D:  Patient's self inventory sheet, patient sleeps good, sleep medication is helpful.  Fair appetite, normal energy level, good concentration.  Rated depression and hopeless 5, anxiety 7.  Denied withdrawals.  Denied SI.  Physical problems, pain, R scapula area, worst pain #5 in past 24 hours.  Goal is to" try to stay calm and not have any panic attacks.  I woke up having one." A:  Medications administered per MD orders.  Emotional support and encouragement given patient. R:  Denied SI and HI, contracts for safety.  Denied A/V hallucinations.  Safety maintained with 15 minute checks.

## 2017-01-22 NOTE — Progress Notes (Signed)
Recreation Therapy Notes  Date:  01/22/17  Time: 0930 Location: 300 Hall Group Room  Group Topic: Stress Management  Goal Area(s) Addresses:  Patient will verbalize importance of using healthy stress management.  Patient will identify positive emotions associated with healthy stress management.   Intervention: Stress Management  Activity :  Meditation.  LRT introduced the stress management technique of meditation.  Patients were to follow along with the meditation as it guided them on letting things that are burdensome in order to progress.  Education:  Stress Management, Discharge Planning.   Education Outcome: Acknowledges edcuation/In group clarification offered/Needs additional education  Clinical Observations/Feedback: Pt did not attend group.    Caroll RancherMarjette Rebekka Lobello, LRT/CTRS         Lillia AbedLindsay, Buzz Axel A 01/22/2017 11:59 AM

## 2017-01-22 NOTE — ED Provider Notes (Signed)
[facility]-EMERGENCY DEPT Provider Note   CSN: [id number] Arrival date & time: 01/22/17  1449     History   Chief Complaint Chief Complaint  Patient presents with  . Hand Problem    HPI Dustin Steele is a 41 y.o. male with history of ADHD, anxiety, COPD, and panic disorder presents today with chief complaint acute onset, constant right forearm pain.  He states that earlier today approximately 2 hours prior to arrival he was having a panic attack at the behavioral health center.  He states that he went to "punch the pillow because that will not hurt me, but instead I hit the wooden bed frame ".  He endorses constant throbbing pain at the distal ulna which radiates up the forearm and into the hand.  Endorses hand numbness.  Pain worsens and becomes sharp with movement of the wrist and palpation.  Has not tried anything for his symptoms.  Denies weakness.  The history is provided by the patient.    Past Medical History:  Diagnosis Date  . ADHD   . Anxiety   . 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 COPD (chronic obstructive pulmonary disease) (HCC)   . Nicotine dependence   . Panic disorder     Patient Active Problem List   Diagnosis Date Noted  . GAD (generalized anxiety disorder) 01/21/2017  . MDD (major depressive disorder) 01/19/2017  . Cigarette smoker 01/17/2017  . Abnormal PFT 09/23/2016  . COPD GOLD 0/1 still smoking   . Nicotine dependence   . Anxiety   . ADHD   . Panic disorder     Past Surgical History:  Procedure Laterality Date  . FEMUR FRACTURE SURGERY    . FEMUR IM ROD REMOVAL         Home Medications    Prior to Admission medications   Medication Sig Start Date End Date Taking? Authorizing Provider  albuterol (PROVENTIL HFA;VENTOLIN HFA) 108 (90 Base) MCG/ACT inhaler Inhale 2 puffs into the lungs every 6 (six) hours as needed for wheezing or shortness of breath.    [provider]  albuterol (PROVENTIL) (2.5 MG/3ML) 0.083%  nebulizer solution Take 3 mLs (2.5 mg total) by nebulization every 6 (six) hours as needed for wheezing or shortness of breath. 10/17/16   Zadie RhineWickline, Donald, MD  ALPRAZolam Prudy Feeler(XANAX) 0.5 MG tablet Take 0.5 mg by mouth 2 (two) times daily as needed for anxiety.    [provider]  budesonide-formoterol (SYMBICORT) 160-4.5 MCG/ACT inhaler Inhale 2 puffs into the lungs 2 (two) times daily. 01/17/17   Nyoka CowdenWert, Michael B, MD  busPIRone (BUSPAR) 15 MG tablet Take 15 mg by mouth 3 (three) times daily.    [provider]  lithium carbonate 300 MG capsule Take 300 mg by mouth daily.    [provider]  valACYclovir (VALTREX) 500 MG tablet Take 500 mg by mouth 2 (two) times daily.    [provider]  venlafaxine (EFFEXOR) 37.5 MG tablet Take 75 mg by mouth daily.    [provider]    Family History History reviewed. No pertinent family history.  Social History Social History   Tobacco Use  . Smoking status: Current Every Day Smoker    Packs/day: 0.75    Years: 20.00    Pack years: 15.00    Types: Cigarettes  . Smokeless tobacco: Never Used  Substance Use Topics  . Alcohol use: Yes    Alcohol/week: 1.2 oz    Types: 2 Cans of  beer per week    Comment: social  . Drug use: Yes    Types: Marijuana     Allergies   Patient has no known allergies.   Review of Systems Review of Systems  Musculoskeletal: Positive for arthralgias (R forearm).  Neurological: Positive for numbness. Negative for weakness.  Psychiatric/Behavioral: The patient is nervous/anxious.      Physical Exam Updated Vital Signs BP 136/80 (BP Location: Left Arm)   Pulse 98   Temp 98 F (36.7 C) (Oral)   Resp 18   Ht 5\' 5"  (1.651 m)   Wt 60.3 kg (133 lb)   SpO2 100%   BMI 22.13 kg/m   Physical Exam  Constitutional: He appears well-developed and well-nourished. No distress.  Tearful, anxious in appearance  HENT:  Head: Normocephalic and atraumatic.  Eyes: Conjunctivae are  normal. Right eye exhibits no discharge. Left eye exhibits no discharge.  Neck: No JVD present. No tracheal deviation present.  Cardiovascular: Normal rate and intact distal pulses.  2+ radial pulses bilaterally  Pulmonary/Chest: Effort normal.  Abdominal: He exhibits no distension.  Musculoskeletal: Normal range of motion. He exhibits tenderness. He exhibits no edema.  Normal range of motion of the right forearm, wrist, and digits, with pain elicited on flexion, extension, ulnar and radial deviation of the wrist.  He is tender to palpation overlying the distal ulna on the volar aspect of the right forearm.  There is some swelling overlying this area.  There is some ecchymosis overlying the volar aspect of the forearm, patient states he is unsure where this came from but it was present prior to his injury.  No snuffbox tenderness.  5/5 strength of BUE major muscle groups.  Good grip strength bilaterally.  No crepitus noted.  Neurological: He is alert.  Fluent speech, no facial droop, sensation intact to soft touch of bilateral upper extremities  Skin: Skin is warm and dry. No erythema.  Psychiatric: He has a normal mood and affect. His behavior is normal.  Nursing note and vitals reviewed.    ED Treatments / Results  Labs (all labs ordered are listed, but only abnormal results are displayed) Labs Reviewed  TSH - Abnormal; Notable for the following components:      Result Value   TSH 0.318 (*)    All other components within normal limits    EKG  EKG Interpretation None       Radiology Dg Forearm Right  Result Date: 01/22/2017 CLINICAL DATA:  Pain after hitting bed frame EXAM: RIGHT FOREARM - 2 VIEW COMPARISON:  None. FINDINGS: Frontal and lateral views were obtained. There is a subtle linear lucency in the distal radial metaphysis. This lucency may represent a prominent nutrient foramen. A subtle incomplete nondisplaced fracture could present in this manner, however. No other  potential fracture evident. No dislocation. No elbow joint effusion. No appreciable joint space narrowing or erosion. IMPRESSION: Question nutrient foramen versus subtle incomplete fracture distal radial metaphysis. Advise dedicated right wrist images to further assess in this regard. No other evidence of potential fracture. No dislocation. No appreciable arthropathic change. Electronically Signed   By: Bretta Bang III M.D.   On: 01/22/2017 15:55   Dg Wrist Complete Right  Result Date: 01/22/2017 CLINICAL DATA:  Acute right wrist pain after injury today. EXAM: RIGHT WRIST - COMPLETE 3+ VIEW COMPARISON:  None. FINDINGS: There is no evidence of fracture or dislocation. There is no evidence of arthropathy or other focal bone abnormality. Soft tissues are unremarkable. IMPRESSION:  Normal right wrist. Electronically Signed   By: Lupita RaiderJames  Green Jr, M.D.   On: 01/22/2017 16:59    Procedures Procedures (including critical care time)  Medications Ordered in ED Medications  alum & mag hydroxide-simeth (MAALOX/MYLANTA) 200-200-20 MG/5ML suspension 30 mL (not administered)  magnesium hydroxide (MILK OF MAGNESIA) suspension 30 mL (not administered)  traZODone (DESYREL) tablet 50 mg (50 mg Oral Given 01/21/17 2056)  mometasone-formoterol (DULERA) 200-5 MCG/ACT inhaler 2 puff (2 puffs Inhalation Given 01/22/17 0755)  nicotine (NICODERM CQ - dosed in mg/24 hours) patch 21 mg (21 mg Transdermal Patch Applied 01/22/17 0756)  venlafaxine XR (EFFEXOR-XR) 24 hr capsule 112.5 mg (112.5 mg Oral Given 01/22/17 0756)  ibuprofen (ADVIL,MOTRIN) tablet 600 mg (600 mg Oral Given 01/22/17 0802)  clonazePAM (KLONOPIN) tablet 0.5 mg (0.5 mg Oral Given 01/22/17 1725)  hydrOXYzine (ATARAX/VISTARIL) tablet 50 mg (50 mg Oral Given 01/22/17 1240)  HYDROcodone-acetaminophen (NORCO/VICODIN) 5-325 MG per tablet 1 tablet (not administered)  valACYclovir (VALTREX) tablet 500 mg (500 mg Oral Given 01/21/17 1656)  pneumococcal 23 valent  vaccine (PNU-IMMUNE) injection 0.5 mL (0.5 mLs Intramuscular Given 01/22/17 0957)  ibuprofen (ADVIL,MOTRIN) tablet 800 mg (800 mg Oral Given 01/22/17 1554)     Initial Impression / Assessment and Plan / ED Course  I have reviewed the triage vital signs and the nursing notes.  Pertinent labs & imaging results that were available during my care of the patient were reviewed by me and considered in my medical decision making (see chart for details).     Patient with right forearm pain after punching a bedpost.  Afebrile, vital signs are stable.  He is neurovascularly intact.  Radiographs reviewed by me show no bony abnormality such as fracture or dislocation. Doubt septic joint or gout in the presence of trauma. Pain has been managed while in the ED.  Will place in a splint for comfort.   RICE therapy discussed with patient.  He will follow-up with orthopedics if symptoms persist.  Discussed indications for return to the ED. Pt verbalized understanding of and agreement with plan and is safe for discharge home at this time. No complaints prior to discharge.   Final Clinical Impressions(s) / ED Diagnoses   Final diagnoses:  Right forearm pain    ED Discharge Orders    None       Jeanie SewerFawze, Deray Dawes A, PA-C 01/22/17 1739    Alvira MondaySchlossman, Erin, MD 01/24/17 1443

## 2017-01-22 NOTE — Progress Notes (Signed)
Adult Psychoeducational Group Note  Date:  01/22/2017 Time:  12:11 AM  Group Topic/Focus:  Wrap-Up Group:   The focus of this group is to help patients review their daily goal of treatment and discuss progress on daily workbooks.  Participation Level:  Did Not Attend  Participation Quality:  Did not attend  Affect:  Did not attend  Cognitive:  Did not attend  Insight: None  Engagement in Group:  Did not attend  Modes of Intervention:  Did not attend  Additional Comments:  Did not attend  Lyndee HensenGoins, Shakiah Wester R 01/22/2017, 12:11 AM

## 2017-01-22 NOTE — Progress Notes (Signed)
[facility] Progress Note  01/22/2017 2:40 PM Dustin Steele  MRN:  [id number]   Subjective:  Patient reports that he is doing good today. He denies any SI/HI/AVH and contracts for safety. He admits to depression rated at 4/10 and anxiety rated at 4/10. He states that he has been talking to his wife and she is very supportive. "She is the reason I get to my appointments."  Objective: Patient's chart and findings reviewed and dicussed with treatment team. Patient is pleasant and cooperative. He has been seen in the day room interacting appropriately. He presents with appropriate affect and pleasant mood. Due to reported history of abuse of controlled substances, will decrease the Klonopin to BID. Will continue all other medications.   To note: Patient became agitated a while after I saw him. He was agitated over his Klonopin and punched his bed. When he punched the bed he missed the mattress and hit his right wrist on the wood frame. [facility]   Principal Problem: MDD (major depressive disorder) Diagnosis:   Patient Active Problem List   Diagnosis Date Noted  . GAD (generalized anxiety disorder) [F41.1] 01/21/2017  . MDD (major depressive disorder) [F32.9] 01/19/2017  . Cigarette smoker [F17.210] 01/17/2017  . Abnormal PFT [R94.2] 09/23/2016  . COPD GOLD 0/1 still smoking [J44.9]   . Nicotine dependence [F17.200]   . Anxiety [F41.9]   . ADHD [F90.9]   . Panic disorder [F41.0]    Total Time spent with patient: 25 minutes  Past Psychiatric History: See H&P  Past Medical History:  Past Medical History:  Diagnosis Date  . ADHD   . Anxiety   . 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 COPD (chronic obstructive pulmonary disease) (HCC)   . Nicotine dependence   . Panic disorder     Past Surgical History:  Procedure Laterality Date  . FEMUR FRACTURE SURGERY    . FEMUR IM ROD REMOVAL     Family History: History reviewed. No pertinent family history. Family Psychiatric   History: See H&P Social History:  Social History   Substance and Sexual Activity  Alcohol Use Yes  . Alcohol/week: 1.2 oz  . Types: 2 Cans of beer per week   Comment: social     Social History   Substance and Sexual Activity  Drug Use Yes  . Types: Marijuana    Social History   Socioeconomic History  . Marital status: Married    Spouse name: None  . Number of children: None  . Years of education: None  . Highest education level: None  Social Needs  . Financial resource strain: None  . Food insecurity - worry: None  . Food insecurity - inability: None  . Transportation needs - medical: None  . Transportation needs - non-medical: None  Occupational History  . None  Tobacco Use  . Smoking status: Current Every Day Smoker    Packs/day: 0.75    Years: 20.00    Pack years: 15.00    Types: Cigarettes  . Smokeless tobacco: Never Used  Substance and Sexual Activity  . Alcohol use: Yes    Alcohol/week: 1.2 oz    Types: 2 Cans of beer per week    Comment: social  . Drug use: Yes    Types: Marijuana  . Sexual activity: None  Other Topics Concern  . None  Social History Narrative  . None   Additional Social History:  Sleep: Good  Appetite:  Good  Current Medications: Current Facility-Administered Medications  Medication Dose Route Frequency Provider Last Rate Last Dose  . alum & mag hydroxide-simeth (MAALOX/MYLANTA) 200-200-20 MG/5ML suspension 30 mL  30 mL Oral Q4H PRN Okonkwo, Justina A, NP      . clonazePAM (KLONOPIN) tablet 0.5 mg  0.5 mg Oral BID Marvion Bastidas, Gerlene Burdockravis B, FNP      . hydrOXYzine (ATARAX/VISTARIL) tablet 50 mg  50 mg Oral TID PRN Kyannah Climer, Gerlene Burdockravis B, FNP   50 mg at 01/22/17 1240  . ibuprofen (ADVIL,MOTRIN) tablet 600 mg  600 mg Oral Q8H PRN Sha Amer, Gerlene Burdockravis B, FNP   600 mg at 01/22/17 0802  . magnesium hydroxide (MILK OF MAGNESIA) suspension 30 mL  30 mL Oral Daily PRN Okonkwo, Justina A, NP      . mometasone-formoterol  (DULERA) 200-5 MCG/ACT inhaler 2 puff  2 puff Inhalation BID Okonkwo, Justina A, NP   2 puff at 01/22/17 0755  . nicotine (NICODERM CQ - dosed in mg/24 hours) patch 21 mg  21 mg Transdermal Daily Cobos, Rockey SituFernando A, MD   21 mg at 01/22/17 0756  . traZODone (DESYREL) tablet 50 mg  50 mg Oral QHS PRN Okonkwo, Justina A, NP   50 mg at 01/21/17 2056  . venlafaxine XR (EFFEXOR-XR) 24 hr capsule 112.5 mg  112.5 mg Oral Daily Cobos, Rockey SituFernando A, MD   112.5 mg at 01/22/17 [id number]    Lab Results:  Results for orders placed or performed during the hospital encounter of 01/19/17 (from the past 48 hour(s))  TSH     Status: Abnormal   Collection Time: 01/21/17  6:23 AM  Result Value Ref Range   TSH 0.318 (L) 0.350 - 4.500 uIU/mL    Comment: Performed by a 3rd Generation assay with a functional sensitivity of <=0.01 uIU/mL. Performed at [facility], [address] [place], [place] [address]     Blood Alcohol level:  Lab Results  Component Value Date   ETH <10 01/19/2017    Metabolic Disorder Labs: No results found for: HGBA1C, MPG No results found for: PROLACTIN No results found for: CHOL, TRIG, HDL, CHOLHDL, VLDL, LDLCALC  Physical Findings: AIMS: Facial and Oral Movements Muscles of Facial Expression: None, normal Lips and Perioral Area: None, normal Jaw: None, normal Tongue: None, normal,Extremity Movements Upper (arms, wrists, hands, fingers): None, normal Lower (legs, knees, ankles, toes): None, normal, Trunk Movements Neck, shoulders, hips: None, normal, Overall Severity Severity of abnormal movements (highest score from questions above): None, normal Incapacitation due to abnormal movements: None, normal Patient's awareness of abnormal movements (rate only patient's report): No Awareness, Dental Status Current problems with teeth and/or dentures?: No Does patient usually wear dentures?: No  CIWA:    COWS:     Musculoskeletal: Strength & Muscle Tone: within  normal limits Gait & Station: normal Patient leans: N/A  Psychiatric Specialty Exam: Physical Exam  Nursing note and vitals reviewed. Constitutional: He is oriented to person, place, and time. He appears well-developed and well-nourished.  Cardiovascular: Normal rate.  Respiratory: Effort normal.  Musculoskeletal: Normal range of motion.  Neurological: He is alert and oriented to person, place, and time.  Skin: Skin is warm.    Review of Systems  Constitutional: Negative.   HENT: Negative.   Eyes: Negative.   Respiratory: Negative.   Cardiovascular: Negative.   Gastrointestinal: Negative.   Genitourinary: Negative.   Musculoskeletal: Negative.   Skin: Negative.   Neurological: Negative.   Endo/Heme/Allergies: Negative.  Psychiatric/Behavioral: Positive for depression. Negative for hallucinations and suicidal ideas. The patient is nervous/anxious.     Blood pressure 123/74, pulse 98, temperature 98.3 F (36.8 C), resp. rate 16, height 5\' 5"  (1.651 m), weight 60.3 kg (133 lb).Body mass index is 22.13 kg/m.  General Appearance: Casual  Eye Contact:  Good  Speech:  Clear and Coherent and Normal Rate  Volume:  Normal  Mood:  Depressed  Affect:  Flat  Thought Process:  Goal Directed and Descriptions of Associations: Intact  Orientation:  Full (Time, Place, and Person)  Thought Content:  WDL  Suicidal Thoughts:  No  Homicidal Thoughts:  No  Memory:  Immediate;   Good Recent;   Good Remote;   Good  Judgement:  Good  Insight:  Good  Psychomotor Activity:  Normal  Concentration:  Concentration: Good and Attention Span: Good  Recall:  Good  Fund of Knowledge:  Good  Language:  Good  Akathisia:  No  Handed:  Right  AIMS (if indicated):     Assets:  Communication Skills Desire for Improvement Financial Resources/Insurance Housing Physical Health Social Support Transportation  ADL's:  Intact  Cognition:  WNL  Sleep:  Number of Hours: 5.75   Problems  Addressed: MDD GAD  Treatment Plan Summary: Daily contact with patient to assess and evaluate symptoms and progress in treatment, Medication management and Plan is to:  -Continue Effexor-XR 112.5 mg PO Daily for mood stability -Continue Trazodone 50 mg PO QHS PRN for insomonia -Decrease Klonopin 0.5 mg PO BID for anxiety -Continue Vistaril 50 mg PO TID PRN for anxiety -Encourage group therapy participation.  Gerlene Burdock Amaury Kuzel, FNP 01/22/2017, 2:40 PM

## 2017-01-22 NOTE — Discharge Instructions (Signed)
Alternate 600 mg of ibuprofen and [phone] mg of Tylenol every 3 hours as needed for pain. Do not exceed 4000 mg of Tylenol daily.  Wear the wrist splint for comfort, but do gentle range of motion exercises to avoid stiffness.  Apply ice for comfort.  Follow-up with orthopedics if symptoms persist greater than 1 week.  Return to the ED if any concerning signs or symptoms develop such as fever, severe swelling, numbness, or weakness.

## 2017-01-22 NOTE — Progress Notes (Signed)
[facility].

## 2017-01-22 NOTE — Progress Notes (Signed)
Patient's wife called nurse and stated there has been a misunderstanding about patient's medications.  That patient had been buying pain medications on the street.  That patient had not been buying benzos on street.  That he is prescribed benzos by his MD.  That his medications should not have been reduced by the MD [facility].  Patient's wife will discuss patient's medications with MD.  Wife Precious ReelLisa Garris phone [phone].  Also patient stated while he was at ED he was given tylenol, vicodin and 1700 medications.  Patient has a brace on his R hand, wrist.  Patient's brace has metal component.  Charge nurse informed.  Patient contracts for safety and assured nurse that he needs his brace and will not abuse his brace.  Patient contracts for safety.  Patient stated upon his return from ED that his R hand/wrist pain is #3 and that he feels much better.  Patient stated that he hit the mattress with his R hand and his hand slipped and hit the side of the bed.

## 2017-01-22 NOTE — ED Triage Notes (Signed)
He [facility] where he is currently a voluntary inpatient. He struck a bedframe in anger this afternoon, and c/o pain in right hand/wrist. He is in no distress. Sitter is at bedside.

## 2017-01-23 DIAGNOSIS — J449 Chronic obstructive pulmonary disease, unspecified: Secondary | ICD-10-CM

## 2017-01-23 DIAGNOSIS — F1994 Other psychoactive substance use, unspecified with psychoactive substance-induced mood disorder: Secondary | ICD-10-CM

## 2017-01-23 LAB — RESPIRATORY ALLERGY PROFILE REGION II ~~LOC~~
ALLERGEN, CEDAR TREE, T6: 0.27 kU/L — AB
ALLERGEN, COMM SILVER BIRCH, T3: 0.31 kU/L — AB
ALLERGEN, COTTONWOOD, T14: 0.33 kU/L — AB
ALLERGEN, MULBERRY, T70: 0.22 kU/L — AB
Allergen, D pternoyssinus,d7: 29.3 kU/L — ABNORMAL HIGH
Allergen, Mouse Urine Protein, e78: <0.1 kU/L
Allergen, Oak,t7: 0.29 kU/L — ABNORMAL HIGH
BERMUDA GRASS: 0.34 kU/L — AB
BOX ELDER: 0.35 kU/L — AB
CLASS: 0
CLASS: 0
CLASS: 0
CLASS: 0
CLASS: 0
CLASS: 0
CLASS: 0
CLASS: 0
CLASS: 0
COMMON RAGWEED (SHORT) (W1) IGE: 0.34 kU/L — AB
Class: 0
Class: 0
Class: 0
Class: 0
Class: 0
Class: 0
Class: 0
Class: 0
Class: 0
Class: 0
Class: 1
Class: 1
Class: 1
Class: 4
Class: 4
Cockroach: 0.35 kU/L — ABNORMAL HIGH
D. farinae: 20.7 kU/L — ABNORMAL HIGH
DOG DANDER: 0.11 kU/L — AB
ELM IGE: 0.35 kU/L — AB
IGE (IMMUNOGLOBULIN E), SERUM: 131 kU/L — AB (ref <=114)
JOHNSON GRASS: 0.34 kU/L — AB
PECAN/HICKORY TREE IGE: 0.3 kU/L — AB
ROUGH PIGWEED IGE: 0.3 kU/L — AB
SHEEP SORREL IGE: 0.31 kU/L — AB
Timothy Grass: 0.29 kU/L — ABNORMAL HIGH

## 2017-01-23 LAB — ALPHA-1 ANTITRYPSIN PHENOTYPE: A-1 Antitrypsin, Ser: 145 mg/dL (ref 83–199)

## 2017-01-23 LAB — INTERPRETATION:

## 2017-01-23 LAB — ALPHA-1-ANTITRYPSIN: A-1 Antitrypsin, Ser: 144 mg/dL (ref 83–199)

## 2017-01-23 MED ORDER — CLONAZEPAM 0.5 MG PO TABS: 0.5000 mg | ORAL_TABLET | Freq: Three times a day (TID) | ORAL | Status: DC

## 2017-01-23 MED ORDER — VENLAFAXINE HCL ER 150 MG PO CP24
150.0000 mg | ORAL_CAPSULE | Freq: Every day | ORAL | Status: DC
  Administered 2017-01-24: 150 mg via ORAL
  Filled 2017-01-23 (×3): qty 1

## 2017-01-23 MED ORDER — CLONAZEPAM 0.5 MG PO TABS
ORAL_TABLET | ORAL | Status: AC
  Filled 2017-01-23: qty 1

## 2017-01-23 MED ORDER — CLONAZEPAM 0.5 MG PO TABS
0.5000 mg | ORAL_TABLET | Freq: Three times a day (TID) | ORAL | Status: DC
  Administered 2017-01-23 – 2017-01-24 (×4): 0.5 mg via ORAL
  Filled 2017-01-23 (×3): qty 1

## 2017-01-23 MED ORDER — PROPRANOLOL HCL 10 MG PO TABS
10.0000 mg | ORAL_TABLET | Freq: Three times a day (TID) | ORAL | Status: DC
  Administered 2017-01-23 – 2017-01-24 (×3): 10 mg via ORAL
  Filled 2017-01-23 (×8): qty 1

## 2017-01-23 NOTE — Progress Notes (Signed)
Patient ID: Dustin Steele, male   DOB: 1976/01/03, 41 y.o.   MRN: [id number]  Writer greeted patient this morning at the medication window and patient stated, "nice to see you couldn't come to my room when I was having a panic attack." Patient apologized to Clinical research associatewriter after Clinical research associatewriter informed him that Clinical research associatewriter had just gotten out of report. Patient is irritable however is in no apparent distress. His respirations are even and unlabored. No active hyperventilation at this time. He reports, "I want to see a doctor today because Feliz Beamravis (NP)  is about as useless as an elevator in an outhouse." He states that he would like his medication adjusted. He reports pain in his right hand due to his bone being "bruised." Patient is not wearing his brace at this time. He received PRN Ibuprofen. He denies SI/HI and A/V hallucinations. He reports that his sleep last night was good, his appetite is poor, his energy level is normal, and concentration is good. He rates his depression, hopelessness, and anxiety is 5/10. Scheduled medications administered to patient per physician's orders this morning. Q15 minute safety checks are to be maintained.

## 2017-01-23 NOTE — Progress Notes (Signed)
Pt attended evening wrap up group and stated that today was a 10 for him after he received his anxiety medication and had a good conversation with his wife during visitation.

## 2017-01-23 NOTE — Progress Notes (Signed)
[facility]  01/23/2017 12:14 PM Dustin Steele  MRN:  [id number] Subjective:     41 y.o Caucasian male, married, lives with his family. Background history of Mood disorder. Presented to the ER in company of his wife. Self inflicted a deep cut to own forearm. Reports mood swings which he likened to a "psychotic break". Has been punching walls at home. Says he cut self so as to feel something else.  Patient has had multiple medication changes recently. Taken off stimulants because he was abusing it. His wife reported he was buying pain medications off the streets. Says he was worse with Bupropion and Buspirone. Did not try Lithium long enough. Now on Venlafaxine and Clonazepam. Routine labs significant for low TSH, toxicology is negative,  UDS benzodiazepines and THC. No alcohol.  Chart reviewed today. Patient discussed at team today.  Staff reports that he continues to report anxiety attacks. He has been easily irritated. Limited group engagement. He was upset with care a few days ago and punched his pillow.   Seen today. Patient was crying in his room just before the interview. Says he has been having more panic attacks. Patient reports history of anxiety attacks since he was a teenager. Says he has been on multiple agents over the years. No benefit from different SSRI and TCA's. Says he has been treated for Bipolar disorder with various mood stabilizers but nothing worked. Gets too sedated with antihistamines and antipsychotics. Says they do not help him. Patient says he felt better on Venlafaxine. Says since Clonazepam was cut down, he has been feeling worse. I explained the rationale in tapering his benzodiazepines. We agreed to get him back to TID until his medications kick in well.  We reviewed his TSH results. Patient reports a family history of thyroid disorder. He had been on propranolol in the past for tachycardia. Patient says he was not trying to kill himself when he scratched himself.  Says he has been feeling very miserable for a long time. Reports a strong family history of thyroid disease. No associated hallucinations. No other evidence of psychosis.  Principal Problem: MDD (major depressive disorder) Diagnosis:   Patient Active Problem List   Diagnosis Date Noted  . GAD (generalized anxiety disorder) [F41.1] 01/21/2017  . MDD (major depressive disorder) [F32.9] 01/19/2017  . Cigarette smoker [F17.210] 01/17/2017  . Abnormal PFT [R94.2] 09/23/2016  . COPD GOLD 0/1 still smoking [J44.9]   . Nicotine dependence [F17.200]   . Anxiety [F41.9]   . ADHD [F90.9]   . Panic disorder [F41.0]    Total Time spent with patient: 20 minutes  Past Psychiatric History: As in H&P  Past Medical History:  Past Medical History:  Diagnosis Date  . ADHD   . Anxiety   . 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 COPD (chronic obstructive pulmonary disease) (HCC)   . Nicotine dependence   . Panic disorder     Past Surgical History:  Procedure Laterality Date  . FEMUR FRACTURE SURGERY    . FEMUR IM ROD REMOVAL     Family History: History reviewed. No pertinent family history. Family Psychiatric  History: As in H&P Social History:  Social History   Substance and Sexual Activity  Alcohol Use Yes  . Alcohol/week: 1.2 oz  . Types: 2 Cans of beer per week   Comment: social     Social History   Substance and Sexual Activity  Drug Use Yes  . Types: Marijuana    Social  History   Socioeconomic History  . Marital status: Married    Spouse name: None  . Number of children: None  . Years of education: None  . Highest education level: None  Social Needs  . Financial resource strain: None  . Food insecurity - worry: None  . Food insecurity - inability: None  . Transportation needs - medical: None  . Transportation needs - non-medical: None  Occupational History  . None  Tobacco Use  . Smoking status: Current Every Day Smoker    Packs/day: 0.75    Years: 20.00    Pack years:  15.00    Types: Cigarettes  . Smokeless tobacco: Never Used  Substance and Sexual Activity  . Alcohol use: Yes    Alcohol/week: 1.2 oz    Types: 2 Cans of beer per week    Comment: social  . Drug use: Yes    Types: Marijuana  . Sexual activity: None  Other Topics Concern  . None  Social History Narrative  . None   Additional Social History:      Sleep: Fair  Appetite:  Fair  Current Medications: Current Facility-Administered Medications  Medication Dose Route Frequency Provider Last Rate Last Dose  . alum & mag hydroxide-simeth (MAALOX/MYLANTA) 200-200-20 MG/5ML suspension 30 mL  30 mL Oral Q4H PRN Okonkwo, Justina A, NP      . clonazePAM (KLONOPIN) tablet 0.5 mg  0.5 mg Oral BID Money, Feliz Beam B, FNP   0.5 mg at 01/23/17 0751  . hydrOXYzine (ATARAX/VISTARIL) tablet 50 mg  50 mg Oral TID PRN Money, Gerlene Burdock, FNP   50 mg at 01/22/17 2118  . ibuprofen (ADVIL,MOTRIN) tablet 600 mg  600 mg Oral Q8H PRN Money, Gerlene Burdock, FNP   600 mg at 01/23/17 0752  . magnesium hydroxide (MILK OF MAGNESIA) suspension 30 mL  30 mL Oral Daily PRN Okonkwo, Justina A, NP      . mometasone-formoterol (DULERA) 200-5 MCG/ACT inhaler 2 puff  2 puff Inhalation BID Okonkwo, Justina A, NP   2 puff at 01/23/17 0751  . nicotine (NICODERM CQ - dosed in mg/24 hours) patch 21 mg  21 mg Transdermal Daily Cobos, Rockey Situ, MD   21 mg at 01/23/17 0758  . traZODone (DESYREL) tablet 50 mg  50 mg Oral QHS PRN Beryle Lathe, Justina A, NP   50 mg at 01/22/17 2118  . venlafaxine XR (EFFEXOR-XR) 24 hr capsule 112.5 mg  112.5 mg Oral Daily Cobos, Rockey Situ, MD   112.5 mg at 01/23/17 1610    Lab Results: No results found for this or any previous visit (from the past 48 hour(s)).  Blood Alcohol level:  Lab Results  Component Value Date   ETH <10 01/19/2017    Metabolic Disorder Labs: No results found for: HGBA1C, MPG No results found for: PROLACTIN No results found for: CHOL, TRIG, HDL, CHOLHDL, VLDL, LDLCALC  Physical  Findings: AIMS: Facial and Oral Movements Muscles of Facial Expression: None, normal Lips and Perioral Area: None, normal Jaw: None, normal Tongue: None, normal,Extremity Movements Upper (arms, wrists, hands, fingers): None, normal Lower (legs, knees, ankles, toes): None, normal, Trunk Movements Neck, shoulders, hips: None, normal, Overall Severity Severity of abnormal movements (highest score from questions above): None, normal Incapacitation due to abnormal movements: None, normal Patient's awareness of abnormal movements (rate only patient's report): No Awareness, Dental Status Current problems with teeth and/or dentures?: No Does patient usually wear dentures?: No  CIWA:    COWS:  Musculoskeletal: Strength & Muscle Tone: within normal limits Gait & Station: normal Patient leans: N/A  Psychiatric Specialty Exam: Physical Exam  Constitutional: He is oriented to person, place, and time. He appears well-developed and well-nourished.  Respiratory: Effort normal.  Neurological: He is alert and oriented to person, place, and time.  Psychiatric:  As above.     ROS  Blood pressure 105/67, pulse (!) 113, temperature 97.7 F (36.5 C), temperature source Oral, resp. rate 16, height 5\' 5"  (1.651 m), weight 60.3 kg (133 lb), SpO2 98 %.Body mass index is 22.13 kg/m.  General Appearance: Slim build, was crying in his room just before the interview. No tremors, no sweaty palms. No lid retraction or exopthalmus.   Eye Contact:  Good  Speech:  Clear and Coherent  Volume:  Normal  Mood:  Anxious and Irritable  Affect:  Congruent  Thought Process:  Linear  Orientation:  Full (Time, Place, and Person)  Thought Content:  Wants to get better. No thoughts of violence. No hallucination in any modality.   Suicidal Thoughts:  No  Homicidal Thoughts:  No  Memory:  Immediate;   Good Recent;   Good Remote;   Good  Judgement:  Fair  Insight:  Good  Psychomotor Activity:  Increased   Concentration:  Concentration: Fair and Attention Span: Fair  Recall:  Good  Fund of Knowledge:  Good  Language:  Good  Akathisia:  Negative  Handed:    AIMS (if indicated):     Assets:  Communication Skills Desire for Improvement Housing Intimacy Resilience  ADL's:  Intact  Cognition:  WNL  Sleep:  Number of Hours: 5.75     Treatment Plan Summary: Patient has a long history of Anxiety disorder and SUD. He has been having more anxiety attacks.   His TSH is low. His pulse has been persistently elevated. This is suggestive of hyperthyroidism. This is a known organic cause of anxiety and psychosis. We plan to draw blood for T4 and T3. We discussed use of low dose Propranolol. Patient has used this before. He consented to treatment after we reviewed the risks and benefits. We agreed to adjust his other medications as below.    Psychiatric: Anxiety Disorder ,,,, ? Organic etiology SUD Mood disorder  Medical: ? Hyperthyroidism COPD  Psychosocial:   PLAN: 1. Increase Venlafaxine XR to 150 mg daily 2. Add Propranolol 10 mg TID 3. Increase Clonazepam to 0.5 mg TID 4. T3 and T4 tomorrow.   5. Monitor mood, behavior and interaction with peers 6. Would follow up with results tomorrow.    Georgiann CockerVincent A Liana Camerer, MD 01/23/2017, 12:14 PM

## 2017-01-23 NOTE — Plan of Care (Signed)
  Coping: Ability to verbalize feelings will improve 01/23/2017 1654 - Progressing by Lenord Fellersopson, Saree Krogh Elizabeth, RN

## 2017-01-24 LAB — T4: T4, Total: 6.4 ug/dL (ref 4.5–12.0)

## 2017-01-24 MED ORDER — ALBUTEROL SULFATE HFA 108 (90 BASE) MCG/ACT IN AERS: 2.0000 | INHALATION_SPRAY | Freq: Four times a day (QID) | RESPIRATORY_TRACT | Status: DC | PRN

## 2017-01-24 MED ORDER — NICOTINE 21 MG/24HR TD PT24: 21.0000 mg | MEDICATED_PATCH | Freq: Every day | TRANSDERMAL | 0 refills | Status: DC

## 2017-01-24 MED ORDER — CLONAZEPAM 0.5 MG PO TABS: 0.5000 mg | ORAL_TABLET | Freq: Three times a day (TID) | ORAL | 0 refills | Status: AC

## 2017-01-24 MED ORDER — VENLAFAXINE HCL ER 150 MG PO CP24: 150.0000 mg | ORAL_CAPSULE | Freq: Every day | ORAL | 0 refills | Status: AC

## 2017-01-24 MED ORDER — PROPRANOLOL HCL 10 MG PO TABS: 10.0000 mg | ORAL_TABLET | Freq: Three times a day (TID) | ORAL | 0 refills | Status: DC

## 2017-01-24 MED ORDER — ALBUTEROL SULFATE HFA 108 (90 BASE) MCG/ACT IN AERS
INHALATION_SPRAY | RESPIRATORY_TRACT | Status: AC
  Filled 2017-01-24: qty 6.7

## 2017-01-24 NOTE — Progress Notes (Signed)
Recreation Therapy Notes  Date: 01/24/17 Time: 0930 Location: [address] Dayroom  Group Topic: Stress Management  Goal Area(s) Addresses:  Patient will verbalize importance of using healthy stress management.  Patient will identify positive emotions associated with healthy stress management.   Intervention: Stress Management  Activity :  LRT introduced the stress management technique of meditation.  LRT read a script to allow patients to focus on being steadfast in different situations.  Education:  Stress Management, Discharge Planning.   Education Outcome: Acknowledges edcuation/In group clarification offered/Needs additional education  Clinical Observations/Feedback: Pt did not attend group.    Caroll RancherMarjette Emileo Semel, LRT/CTRS          Caroll RancherLindsay, Alhassan Everingham A 01/24/2017 10:37 AM

## 2017-01-24 NOTE — Progress Notes (Signed)
Patient ID: Dustin ArdsJohn M Steele, male   DOB: 10-26-1975, 41 y.o.   MRN: [id number] DAR Note: Pt observed in dayroom interacting with peers. Pt endorsed moderate anxiety, depression and mild R. wrist pain. Pt denied SI, HI or AVH. Support, encouragement, and safe environment provided.  15-minute safety checks continue. All Pt's questions and concerns addressed. Pt was med compliant. Pt did attend wrap-up group.

## 2017-01-24 NOTE — Progress Notes (Signed)
Recreation Therapy Notes  Date: 01/24/17 Time: 1045 Location: Gym  Group Topic: Wellness  Goal Area(s) Addresses:  Patient will define components of whole wellness. Patient will verbalize benefit of whole wellness.  Behavioral Response: Engaged  Intervention: 4 rubber bases, rubber ball   Activity: Kickball.  Patients were divided into two teams.  Each team would go until they reached three outs.  The teams would then reverse fields and the other team would get a chance to go.  The team that finished with the most points, wins the game.  Education: Wellness, Building control surveyorDischarge Planning.   Education Outcome: Acknowledges education/In group clarification offered/Needs additional education.   Clinical Observations/Feedback:  Pt was bright and active.  Pt forgot the rules a few times but was able to get on track when reminded of the rules.  Pt was very social and played well with peers.    Caroll RancherMarjette Makenzie Steele, LRT/CTRS      Caroll RancherLindsay, Tyniesha Howald A 01/24/2017 11:55 AM

## 2017-01-24 NOTE — Progress Notes (Signed)
  [facility] Discharge Plan :  Will you be returning to the same living situation after discharge:  Yes,  home At discharge, do you have transportation home?: Yes,  wife Do you have the ability to pay for your medications: Yes,  insurance  Release of information consent forms completed and in the chart;  Patient's signature needed at discharge.  Patient to Follow up at: Follow-up Information    [facility] Follow up on 02/04/2017.   Why:  Hospital follow-up/medication management appt with Vikki PortsValerie on Monday at 11:00AM.  Contact information: [address] [place], [place] [address] Phone: [phone] Fax: [phone]       [facility] Follow up on 01/28/2017.   Why:  Therapy appt with Cam Hines on Tuesday at 1:00PM. Thank you. Contact information: [address]  [place] [address] [phone]        Schedule an appointment as soon as possible for a visit with Aundria Rudogers, Noah DelaineJason Patrick, MD.   Specialty:  Orthopedic Surgery Why:  If symptoms persist Contact information: [address] [place] [address] [phone]           Next level of care provider has access to [facility] Link:no  Safety Planning and Suicide Prevention discussed: Yes,  yes  Have you used any form of tobacco in the last 30 days? (Cigarettes, Smokeless Tobacco, Cigars, and/or Pipes): Yes  Has patient been referred to the Quitline?: Patient refused referral  Patient has been referred for addiction treatment: N/A  Ida RogueRodney B Augustin Bun, LCSW 01/24/2017, 10:03 AM

## 2017-01-24 NOTE — Progress Notes (Signed)
Patient discharged home with wife per MD order. Patient given all belongings from locker #50, including shoes, pants, wallet with ID and two credit cards. Patient given follow up instructions and prescriptions. Klonopin script unable to locate by this Clinical research associatewriter, will [facility] [phone]. Patient verbalizes understanding of all instructions including medications.

## 2017-01-24 NOTE — [facility] Suicide Risk Assessment (Addendum)
[facility]   Principal Problem: MDD (major depressive disorder) Discharge Diagnoses:  Patient Active Problem List   Diagnosis Date Noted  . GAD (generalized anxiety disorder) [F41.1] 01/21/2017  . MDD (major depressive disorder) [F32.9] 01/19/2017  . Cigarette smoker [F17.210] 01/17/2017  . Abnormal PFT [R94.2] 09/23/2016  . COPD GOLD 0/1 still smoking [J44.9]   . Nicotine dependence [F17.200]   . Anxiety [F41.9]   . ADHD [F90.9]   . Panic disorder [F41.0]     Total Time spent with patient: 30 minutes  Musculoskeletal: Strength & Muscle Tone: within normal limits Gait & Station: normal Patient leans: N/A  Psychiatric Specialty Exam: Review of Systems  Constitutional: Negative.   HENT: Negative.   Eyes: Negative.   Respiratory: Negative.   Cardiovascular: Negative.   Gastrointestinal: Negative.   Genitourinary: Negative.   Musculoskeletal: Negative.   Skin: Negative.   Neurological: Negative.   Endo/Heme/Allergies: Negative.   Psychiatric/Behavioral:       As above    Blood pressure 127/86, pulse 91, temperature 97.9 F (36.6 C), temperature source Oral, resp. rate 18, height 5\' 5"  (1.651 m), weight 60.3 kg (133 lb), SpO2 98 %.Body mass index is 22.13 kg/m.  General Appearance:  Casually dressed, calm and cooperative. Polite and appropriate.   Eye Contact::  Good  Speech:  Spontaneous, normal prosody. Normal tone and rate.   Volume:  Normal  Mood:  Euthymic  Affect:  Appropriate and Full Range  Thought Process:  Linear  Orientation:  Full (Time, Place, and Person)  Thought Content:  No delusional theme. No preoccupation with violent thoughts. No negative ruminations. No obsession.  No hallucination in any modality.   Suicidal Thoughts:  No  Homicidal Thoughts:  No  Memory:  Immediate;   Good Recent;   Good Remote;   Good  Judgement:  Good  Insight:  Good  Psychomotor Activity:  Normal  Concentration:  Good  Recall:  Good  Fund  of Knowledge:Good  Language: Good  Akathisia:  Negative  Handed:    AIMS (if indicated):     Assets:  Communication Skills Desire for Improvement Housing Intimacy Resilience  Sleep:  Number of Hours: 5.25  Cognition: WNL  ADL's:  Intact   Clinical Assessment::   41 y.o Caucasian male, married, lives with his family. Background history of Mood disorder. Presented to the ER in company of his wife. Self inflicted a deep cut to own forearm. Reports mood swings which he likened to a "psychotic break". Has been punching walls at home. Says he cut self so as to feel something else.  Patient has had multiple medication changes recently. Taken off stimulants because he was abusing it. His wife reported he was buying pain medications off the streets. Says he was worse with Bupropion and Buspirone. Did not try Lithium long enough. Now on Venlafaxine and Clonazepam. Routine labs significant for low TSH, toxicology is negative,  UDS benzodiazepines and THC. No alcohol.  Seen today. Says he is feeling much better on his current regimen. Says he has not felt this good for the past fifteen years. Pleased to learn that his T4 is normal. He has agreed to follow up with his PCP for further work up. Reports that he is in good spirits. Says his anxiety is much less. He has not had any panic attacks since yesterday. Says he has been socializing with peers.  Not feeling depressed. Reports normal energy and interest. Has been maintaining normal biological functions. He is  able to think clearly. He is able to focus on task. His thoughts are not crowded or racing. No evidence of mania. No hallucination in any modality. He is not making any delusional statement. No passivity of will/thought. He is fully in touch with reality. No thoughts of suicide. No thoughts of homicide. No violent thoughts. Patient wants to be home before the storm. No access to weapons.   Nursing staff reports that patient has been appropriate on the  unit. Patient has been interacting well with peers. No behavioral issues. Patient has not voiced any suicidal thoughts. Patient has not been observed to be internally stimulated. Patient has been adherent with treatment recommendations. Patient has been tolerating their medication well.   Patient was discussed at team. Team members feels that patient is back to his baseline level of function. Team agrees with plan to discharge patient today.    Demographic Factors:  Caucasian  Loss Factors: NA  Historical Factors: Impulsivity  Risk Reduction Factors:   Sense of responsibility to family, Religious beliefs about death, Living with another person, especially a relative, Positive social support, Positive therapeutic relationship and Positive coping skills or problem solving skills  Continued Clinical Symptoms:   As above   Cognitive Features That Contribute To Risk:  None    Suicide Risk:  Minimal: No identifiable suicidal ideation.  Patient is not having any thoughts of suicide at this time. Modifiable risk factors targeted during this admission includes depression, anxiety disorder and substance use. Demographical and historical risk factors cannot be modified. Patient is now engaging well. Patient is reliable and is future oriented. We have buffered patient's support structures. At this point, patient is at low risk of suicide. Patient is aware of the effects of psychoactive substances on decision making process. Patient has been provided with emergency contacts. Patient acknowledges to use resources provided if unforseen circumstances changes their current risk stratification.     Follow-up Information    [facility] Follow up on 02/04/2017.   Why:  Hospital follow-up/medication management appt with Vikki PortsValerie on Monday at 11:00AM.  Contact information: [address] [place], [place] [address] Phone: [phone] Fax: [phone]       [facility] Follow up on 01/28/2017.   Why:  Therapy appt with Cam Hines on Tuesday at 1:00PM. Thank you. Contact information: [address] [place] [address] [phone]        [facility]-EMERGENCY [facility]PT.   Specialty:  Emergency Medicine Why:  As needed Contact information: [address] [id number] [address]c [place] [address] [phone]       Schedule an appointment as soon as possible for a visit  with Aundria Rudogers, Noah DelaineJason Patrick, MD.   Specialty:  Orthopedic Surgery Why:  If symptoms persist Contact information: [address] [place] [address] [phone]           Plan Of Care/Follow-up recommendations:  1. Continue current psychotropic medications 2. Mental health and addiction follow up as arranged.  3. Discharge in care of his family 4. Provided limited quantity of prescriptions'   Georgiann CockerVincent A Izediuno, MD 01/24/2017, 9:40 AM

## 2017-01-24 NOTE — Discharge Summary (Signed)
Physician Discharge Summary Note  Patient:  Dustin Steele is an 41 y.o., male MRN:  [id number] DOB:  1975-06-22 Patient phone:  [phone] (home)  Patient address:   [address] [place] [address],  Total Time spent with patient: 20 minutes  Date of Admission:  01/19/2017 Date of Discharge: 01/24/17  Reason for Admission:  Severe anxiety and depression  Principal Problem: MDD (major depressive disorder) Discharge Diagnoses: Patient Active Problem List   Diagnosis Date Noted  . GAD (generalized anxiety disorder) [F41.1] 01/21/2017  . MDD (major depressive disorder) [F32.9] 01/19/2017  . Cigarette smoker [F17.210] 01/17/2017  . Abnormal PFT [R94.2] 09/23/2016  . COPD GOLD 0/1 still smoking [J44.9]   . Nicotine dependence [F17.200]   . Anxiety [F41.9]   . ADHD [F90.9]   . Panic disorder [F41.0]     Past Psychiatric History: no prior psychiatric admissions, denies history of suicide attempts , no prior history of self cutting, denies history of mania, denies history of psychosis, denies history of violence other than punching walls which he states occurs when he is severely anxious . Reports panic disorder, agoraphobia  Past Medical History:  Past Medical History:  Diagnosis Date  . ADHD   . Anxiety   . 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 COPD (chronic obstructive pulmonary disease) (HCC)   . Nicotine dependence   . Panic disorder     Past Surgical History:  Procedure Laterality Date  . FEMUR FRACTURE SURGERY    . FEMUR IM ROD REMOVAL     Family History: History reviewed. No pertinent family history. Family Psychiatric  History: a paternal uncle committed suicide, no alcoholism in family, states he thinks his mother has OCD   Social History:  Social History   Substance and Sexual Activity  Alcohol Use Yes  . Alcohol/week: 1.2 oz  . Types: 2 Cans of beer per week   Comment: social     Social History   Substance and Sexual Activity  Drug Use Yes  . Types:  Marijuana    Social History   Socioeconomic History  . Marital status: Married    Spouse name: None  . Number of children: None  . Years of education: None  . Highest education level: None  Social Needs  . Financial resource strain: None  . Food insecurity - worry: None  . Food insecurity - inability: None  . Transportation needs - medical: None  . Transportation needs - non-medical: None  Occupational History  . None  Tobacco Use  . Smoking status: Current Every Day Smoker    Packs/day: 0.75    Years: 20.00    Pack years: 15.00    Types: Cigarettes  . Smokeless tobacco: Never Used  Substance and Sexual Activity  . Alcohol use: Yes    Alcohol/week: 1.2 oz    Types: 2 Cans of beer per week    Comment: social  . Drug use: Yes    Types: Marijuana  . Sexual activity: None  Other Topics Concern  . None  Social History Narrative  . None    Hospital Course:   [facility] Assessment: 41 year old male,presented to hospital voluntarily . States he has been suffering with significant anxiety recently . To a lesser degree, also describes some depression but stresses that anxiety is major symptom. Reports frequent panic attacks, some agoraphobia. Yesterday prior to admission he impulsively cut self on L forearm ( did not require sutures). States " I was  not trying to kill myself, I was just trying to feel better "). He states he has been punching walls at times due to frustration because of " being so anxious ". States he has been seeing a psychiatrist and has had recent medication changes- was on Wellbutrin XL x 2 weeks, but states " it made my anxiety much worse", was then changed to Effexor XR ( has been on it x 3-4 weeks) , Buspar , and more recently was started on Lithium ( started 4 days ago)   Patient remained on the [facility] unit for 4 days and stabilized. Patient had his Xanax discontinued and was started on Klonopin 0.5 mg TID, continued Effexor-XR and titrated to 150 mg Daily,  and was started on Inderal 10 mg TID. Patient showed improvement with improved mood, affect, interaction, appetite, and sleep. It was suspected that patient may have hyperthyroidism due to low TSH, but T4 came back WNL. Patient became agitated on the unit due to reduction in Klonopin and stated that he went to punch his mattress and missed. He then hit the wooden bed frame and was sent to have his right arm/wrist x-ray. He does not have a fracture, but was provided with a wrist brace. Patient was seen in the day room interacting with staff and peers appropriately. Patient attended groups and participated. Patient denies any SI/HI/AVH and contracts for safety. Patient agrees to [facility] up at [facility]. Patient is provided with prescriptions of his medications upon discharge.     Physical Findings: AIMS: Facial and Oral Movements Muscles of Facial Expression: None, normal Lips and Perioral Area: None, normal Jaw: None, normal Tongue: None, normal,Extremity Movements Upper (arms, wrists, hands, fingers): None, normal Lower (legs, knees, ankles, toes): None, normal, Trunk Movements Neck, shoulders, hips: None, normal, Overall Severity Severity of abnormal movements (highest score from questions above): None, normal Incapacitation due to abnormal movements: None, normal Patient's awareness of abnormal movements (rate only patient's report): No Awareness, Dental Status Current problems with teeth and/or dentures?: No Does patient usually wear dentures?: No  CIWA:    COWS:     Musculoskeletal: Strength & Muscle Tone: within normal limits Gait & Station: normal Patient leans: N/A  Psychiatric Specialty Exam: Physical Exam  Nursing note and vitals reviewed. Constitutional: He is oriented to person, place, and time. He appears well-developed and well-nourished.  Cardiovascular: Normal rate.  Respiratory: Effort normal.  Musculoskeletal: Normal range of motion.  Neurological: He is  alert and oriented to person, place, and time.  Skin: Skin is warm.    Review of Systems  Constitutional: Negative.   HENT: Negative.   Eyes: Negative.   Respiratory: Negative.   Cardiovascular: Negative.   Gastrointestinal: Negative.   Genitourinary: Negative.   Musculoskeletal: Negative.   Skin: Negative.   Neurological: Negative.   Endo/Heme/Allergies: Negative.   Psychiatric/Behavioral: Negative.     Blood pressure 127/86, pulse 91, temperature 97.9 F (36.6 C), temperature source Oral, resp. rate 18, height 5\' 5"  (1.651 m), weight 60.3 kg (133 lb), SpO2 98 %.Body mass index is 22.13 kg/m.  General Appearance: Casual  Eye Contact:  Good  Speech:  Clear and Coherent and Normal Rate  Volume:  Normal  Mood:  Euthymic  Affect:  Appropriate  Thought Process:  Goal Directed and Descriptions of Associations: Intact  Orientation:  Full (Time, Place, and Person)  Thought Content:  WDL  Suicidal Thoughts:  No  Homicidal Thoughts:  No  Memory:  Immediate;   Good Recent;  Good Remote;   Good  Judgement:  Good  Insight:  Good  Psychomotor Activity:  Normal  Concentration:  Concentration: Good and Attention Span: Good  Recall:  Good  Fund of Knowledge:  Good  Language:  Good  Akathisia:  No  Handed:  Right  AIMS (if indicated):     Assets:  Communication Skills Desire for Improvement Financial Resources/Insurance Housing Social Support Transportation  ADL's:  Intact  Cognition:  WNL  Sleep:  Number of Hours: 5.25     Have you used any form of tobacco in the last 30 days? (Cigarettes, Smokeless Tobacco, Cigars, and/or Pipes): Yes  Has this patient used any form of tobacco in the last 30 days? (Cigarettes, Smokeless Tobacco, Cigars, and/or Pipes) Yes, Yes, A prescription for an FDA-approved tobacco cessation medication was ptrovided at discharge.  Blood Alcohol level:  Lab Results  Component Value Date   ETH <10 01/19/2017    Metabolic Disorder Labs:  No results  found for: HGBA1C, MPG No results found for: PROLACTIN No results found for: CHOL, TRIG, HDL, CHOLHDL, VLDL, LDLCALC  See Psychiatric Specialty Exam and Suicide Risk Assessment completed by Attending Physician prior to discharge.  Discharge destination:  Home  Is patient on multiple antipsychotic therapies at discharge:  No   Has Patient had three or more failed trials of antipsychotic monotherapy by history:  No  Recommended Plan for Multiple Antipsychotic Therapies: NA   Allergies as of 01/24/2017   No Known Allergies     Medication List    STOP taking these medications   ALPRAZolam 0.5 MG tablet Commonly known as:  XANAX   busPIRone 15 MG tablet Commonly known as:  BUSPAR   lithium carbonate 300 MG capsule   venlafaxine 37.5 MG tablet Commonly known as:  EFFEXOR Replaced by:  venlafaxine XR 150 MG 24 hr capsule     TAKE these medications     Indication  albuterol 108 (90 Base) MCG/ACT inhaler Commonly known as:  PROVENTIL HFA;VENTOLIN HFA Inhale 2 puffs into the lungs every 6 (six) hours as needed for wheezing or shortness of breath.  Indication:  Asthma   albuterol (2.5 MG/3ML) 0.083% nebulizer solution Commonly known as:  PROVENTIL Take 3 mLs (2.5 mg total) by nebulization every 6 (six) hours as needed for wheezing or shortness of breath.  Indication:  Per PCP   budesonide-formoterol 160-4.5 MCG/ACT inhaler Commonly known as:  SYMBICORT Inhale 2 puffs into the lungs 2 (two) times daily.  Indication:  Asthma   clonazePAM 0.5 MG tablet Commonly known as:  KLONOPIN Take 1 tablet (0.5 mg total) by mouth 3 (three) times daily.  Indication:  Panic Disorder   nicotine 21 mg/24hr patch Commonly known as:  NICODERM CQ - dosed in mg/24 hours Place 1 patch (21 mg total) onto the skin daily. To stop smoking Start taking on:  01/25/2017  Indication:  Nicotine Addiction   propranolol 10 MG tablet Commonly known as:  INDERAL Take 1 tablet (10 mg total) by mouth 3  (three) times daily.  Indication:  for mood control   valACYclovir 500 MG tablet Commonly known as:  VALTREX Take 500 mg by mouth 2 (two) times daily.  Indication:  Herpes Simplex Infection   venlafaxine XR 150 MG 24 hr capsule Commonly known as:  EFFEXOR-XR Take 1 capsule (150 mg total) by mouth daily. For mood control Start taking on:  01/25/2017 Replaces:  venlafaxine 37.5 MG tablet  Indication:  mood stability  Follow-up Information    [facility] Follow up on 02/04/2017.   Why:  Hospital follow-up/medication management appt with Vikki PortsValerie on Monday at 11:00AM.  Contact information: [address] [place], [place] [address] Phone: [phone] Fax: [phone]       [facility] Follow up on 01/28/2017.   Why:  Therapy appt with Cam Hines on Tuesday at 1:00PM. Thank you. Contact information: [address] [place] [address] [phone]        Schedule an appointment as soon as possible for a visit with Aundria Rudogers, Noah DelaineJason Patrick, MD.   Specialty:  Orthopedic Surgery Why:  If symptoms persist Contact information: [address] [place] [address] [phone]           Follow-up recommendations:  Continue activity as tolerated. Continue diet as recommended by your PCP. Ensure to keep all appointments with outpatient providers.  Comments:  Patient is instructed prior to discharge to: Take all medications as prescribed by his/her mental healthcare provider. Report any adverse effects and or reactions from the medicines to his/her outpatient provider promptly. Patient has been instructed & cautioned: To not engage in alcohol and or illegal drug use while on prescription medicines. In the event of worsening symptoms, patient is instructed to call the crisis hotline, 911 and or go to the nearest ED for appropriate evaluation and treatment of symptoms. To follow-up with his/her primary care provider  for your other medical issues, concerns and or health care needs.    Signed: Gerlene Burdockravis B Yahshua Thibault, FNP 01/24/2017, 11:53 AM

## 2017-01-25 LAB — T3: T3, Total: 96 ng/dL (ref 71–180)

## 2017-02-25 ENCOUNTER — Ambulatory Visit: Payer: Self-pay | Admitting: Internal Medicine

## 2017-03-17 ENCOUNTER — Telehealth: Payer: Self-pay | Admitting: Internal Medicine

## 2017-03-17 MED ORDER — BUDESONIDE-FORMOTEROL FUMARATE 160-4.5 MCG/ACT IN AERO: 2.0000 | INHALATION_SPRAY | Freq: Two times a day (BID) | RESPIRATORY_TRACT | 2 refills | Status: DC

## 2017-03-17 NOTE — Telephone Encounter (Signed)
Spoke with pt and advised rx sent to pharmacy. Nothing further is needed.   

## 2017-03-21 ENCOUNTER — Encounter: Payer: Self-pay | Admitting: Internal Medicine

## 2017-03-24 ENCOUNTER — Ambulatory Visit: Payer: Self-pay | Admitting: Internal Medicine

## 2017-04-09 ENCOUNTER — Encounter: Payer: Self-pay | Admitting: Internal Medicine

## 2017-04-09 ENCOUNTER — Ambulatory Visit: Payer: Commercial Managed Care - PPO | Admitting: Internal Medicine

## 2017-04-09 VITALS — BP 122/78 | HR 74 | Ht 65.0 in | Wt 144.0 lb

## 2017-04-09 DIAGNOSIS — J449 Chronic obstructive pulmonary disease, unspecified: Secondary | ICD-10-CM | POA: Diagnosis not present

## 2017-04-09 DIAGNOSIS — F17210 Nicotine dependence, cigarettes, uncomplicated: Secondary | ICD-10-CM | POA: Diagnosis not present

## 2017-04-09 MED ORDER — BUDESONIDE-FORMOTEROL FUMARATE 160-4.5 MCG/ACT IN AERO: 2.0000 | INHALATION_SPRAY | Freq: Two times a day (BID) | RESPIRATORY_TRACT | 0 refills | Status: DC

## 2017-04-09 MED ORDER — PREDNISONE 10 MG PO TABS: ORAL_TABLET | ORAL | 0 refills | Status: DC

## 2017-04-09 MED ORDER — AZITHROMYCIN 250 MG PO TABS: ORAL_TABLET | ORAL | 0 refills | Status: DC

## 2017-04-09 NOTE — Progress Notes (Signed)
Subjective:     Patient ID: Dustin Steele, male   DOB: 1975-06-28, 42 y.o.   MRN: [id number]    Brief patient profile:  42yowm active smoker from [place]  then age16 month hosp with "bronchitis" with 02 but no vent with no need for inhalers in middle school or HS and good ex tol (soccer ) and could run a  Mile in 6 : 10 sec but by mid 20s noted doe and need for maint rx since age 57 = spiriva/advair dpi's referred to pulmonary clinic 01/17/2017 by Chip Boer NP  History of Present Illness  01/17/2017 1st [facility] office visit/ Elfie Costanza   Chief Complaint  Patient presents with  . Follow-up    consult for COPD exacerbations up to 3 times a year. 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  Work on inhaler technique:    Plan B = Backup Only use your albuterol as a rescue medication Plan C = Crisis - only use your albuterol nebulizer if you first try Plan B    04/09/2017  f/u ov/Dixie Jafri re:  Still smoking   -  Thinking about starting propranolol for nerves per psych Chief Complaint  Patient presents with  . Follow-up    dry cough, SOB, chest discomort, sneezing, no post nasal drip,   Baseline Dyspnea:  No limits at baseline Cough: no Sleep: ok No need for any alb  Then starting 04/06/17 scratch throat / nasal congestion > then dry hacking cough, gen ant cp with coughing only  wants cough syrup  No obvious day to day or daytime variability or assoc excess/ purulent sputum or mucus plugs or hemoptysis   or chest  tightness, subjective wheeze or overt sinus or hb symptoms. No unusual exposure hx or h/o childhood pna/ asthma or knowledge of premature birth.  Sleeping ok flat without nocturnal  or early am exacerbation  of respiratory  c/o's or need for noct saba. Also denies any obvious fluctuation of symptoms with weather or environmental changes or other aggravating or alleviating factors except as outlined above   Current Allergies, Complete Past Medical History, Past Surgical History, Family History, and Social History were reviewed in [employer] [facility] record.  ROS  The following are not active complaints unless bolded Hoarseness, sore throat, dysphagia, dental problems, itching, sneezing,  nasal congestion or discharge of excess mucus or purulent secretions, ear ache,   fever, chills, sweats, unintended wt loss or wt gain, classically pleuritic or exertional cp,  orthopnea pnd or leg swelling, presyncope, palpitations, abdominal pain, anorexia, nausea, vomiting, diarrhea  or change in bowel habits or change in bladder habits, change in stools or change in urine, dysuria, hematuria,  rash, arthralgias, visual complaints, headache, numbness, weakness or ataxia or problems with walking or coordination,  change in mood/affect or memory.        Current Meds  Medication Sig  . albuterol (PROVENTIL HFA;VENTOLIN HFA) 108 (90 Base) MCG/ACT  inhaler Inhale 2 puffs into the lungs every 6 (six) hours as needed for wheezing or shortness of breath.  Marland Kitchen. albuterol (PROVENTIL) (2.5 MG/3ML) 0.083% nebulizer solution Take 3 mLs (2.5 mg total) by nebulization every 6 (six) hours as needed for wheezing or shortness of breath.  . budesonide-formoterol (SYMBICORT) 160-4.5 MCG/ACT inhaler Inhale 2 puffs into the lungs 2 (two) times daily.  . clonazePAM (KLONOPIN) 0.5 MG tablet Take 1 tablet (0.5 mg total) by mouth 3 (three) times daily.  . nicotine (NICODERM CQ - DOSED IN MG/24 HOURS) 21 mg/24hr patch Place 1  patch (21 mg total) onto the skin daily. To stop smoking  . propranolol (INDERAL) 10 MG tablet Take 1 tablet (10 mg total) by mouth 3 (three) times daily. (Patient taking differently: Take 60 mg by mouth daily. )  . venlafaxine XR (EFFEXOR-XR) 150 MG 24 hr capsule Take 1 capsule (150 mg total) by mouth daily. For mood control (Patient taking differently: Take 300 mg by mouth daily. For mood control)  .                         Objective:   Physical Exam     amb wm harsh cough    04/09/2017      144   01/17/17 130 lb 3.2 oz (59.1 kg)  12/11/16 135 lb (61.2 kg)  10/17/16 147 lb (66.7 kg)    Vital signs reviewed - Note on arrival 02 sats  98% on RA       HEENT: nl  turbinates bilaterally, and oropharynx. Nl external ear canals without cough reflex- poor dentition    NECK :  without JVD/Nodes/TM/ nl carotid upstrokes bilaterally   LUNGS: no acc muscle use,  Nl contour chest with minimal insp/exp rhonchi bilaterally   CV:  RRR  no s3 or murmur or increase in P2, and no edema   ABD:  soft and nontender with nl inspiratory excursion in the supine position. No bruits or organomegaly appreciated, bowel sounds nl  MS:  Nl gait/ ext warm without deformities, calf tenderness, cyanosis or clubbing No obvious joint restrictions   SKIN: warm and dry without lesions    NEURO:  alert, approp, nl sensorium with  no motor or cerebellar deficits apparent.          Assessment:

## 2017-04-09 NOTE — Patient Instructions (Addendum)
No change on symbicort 160 Take 2 puffs first thing in am and then another 2 puffs about 12 hours later.   Work on inhaler technique:  relax and gently blow all the way out then take a nice smooth deep breath back in, triggering the inhaler at same time you start breathing in.  Hold for up to 5 seconds if you can. Blow out thru nose. Rinse and gargle with water when done     zpak / Prednisone 10 mg take  4 each am x 2 days,   2 each am x 2 days,  1 each am x 2 days and stop    Please schedule a follow up office visit in 6 weeks, call sooner if needed with pfts on return - don't take  symbicort that morning

## 2017-04-10 ENCOUNTER — Encounter: Payer: Self-pay | Admitting: Internal Medicine

## 2017-04-10 NOTE — Assessment & Plan Note (Signed)
Spirometry 01/17/2017  FEV1 3.47 (96%)  Ratio 70  With mild / mod curvature and < 6 sec exp - Allergy profile 01/17/2017 >  Eos 0.0 /  IgE  131 with RAST pos dust > grass , ragweed, tree, dog  - Alpha one AT screening 01/17/2017   MM level 144  - 01/17/2017    try off dpi's and use symb 160 2bid  - 04/09/2017  After extensive coaching inhaler device  effectiveness =    75% (Ti too short)   Overall has been much better despite continued smoking and suboptimal hfa   >>  now mild flare from uri but no reason to change maint  rx  rec Pred/zpak/ mucinex dm otc May need to change propranolol to alternative if breathing any worse on it   I had an extended discussion with the patient reviewing all relevant studies completed to date and  lasting 15 to 20 minutes of a 25 minute visit    Each maintenance medication was reviewed in detail including most importantly the difference between maintenance and prns and under what circumstances the prns are to be triggered using an action plan format that is not reflected in the computer generated alphabetically organized AVS.    Please see AVS for specific instructions unique to this visit that I personally wrote and verbalized to the the pt in detail and then reviewed with pt  by my nurse highlighting any  changes in therapy recommended at today's visit to their plan of care.

## 2017-04-10 NOTE — Assessment & Plan Note (Signed)
>   3 min discussion I reviewed the Fletcher curve with the patient that basically indicates  if you quit smoking when your best day FEV1 is still well preserved (as is clearly  the case here)  it is highly unlikely you will progress to severe disease and informed the patient there was  no medication on the market that has proven to alter the curve/ its downward trajectory  or the likelihood of progression of their disease(unlike other chronic medical conditions such as atheroclerosis where we do think we can change the natural hx with risk reducing meds)    Therefore stopping smoking and maintaining abstinence are  the most important aspects of his care, not choice of inhalers or for that matter, doctors.   Treatment other than smoking cessation  is entirely directed by severity of symptoms and focused also on reducing exacerbations, not attempting to change the natural history of the disease.

## 2017-04-22 ENCOUNTER — Encounter: Payer: Self-pay | Admitting: Family Medicine

## 2017-04-22 ENCOUNTER — Telehealth: Payer: Self-pay | Admitting: Internal Medicine

## 2017-04-22 ENCOUNTER — Ambulatory Visit: Payer: Commercial Managed Care - PPO | Admitting: Family Medicine

## 2017-04-22 ENCOUNTER — Ambulatory Visit: Payer: Self-pay | Admitting: Adult Health

## 2017-04-22 VITALS — BP 110/78 | HR 112 | Temp 98.7°F | Resp 16 | Wt 137.8 lb

## 2017-04-22 DIAGNOSIS — J449 Chronic obstructive pulmonary disease, unspecified: Secondary | ICD-10-CM | POA: Diagnosis not present

## 2017-04-22 DIAGNOSIS — F17210 Nicotine dependence, cigarettes, uncomplicated: Secondary | ICD-10-CM

## 2017-04-22 DIAGNOSIS — R059 Cough, unspecified: Secondary | ICD-10-CM

## 2017-04-22 DIAGNOSIS — R05 Cough: Secondary | ICD-10-CM | POA: Diagnosis not present

## 2017-04-22 MED ORDER — DOXYCYCLINE HYCLATE 100 MG PO TABS: 100.0000 mg | ORAL_TABLET | Freq: Two times a day (BID) | ORAL | 0 refills | Status: DC

## 2017-04-22 MED ORDER — PROMETHAZINE-DM 6.25-15 MG/5ML PO SYRP: 5.0000 mL | ORAL_SOLUTION | Freq: Every evening | ORAL | 0 refills | Status: DC | PRN

## 2017-04-22 NOTE — Progress Notes (Signed)
Chief Complaint  Patient presents with  . coughing , congestion , chest tightness , short breath    strated last friday     Subjective:  Dustin Steele is a 42 y.o. male who presents for a 2-3 week history of sore throat, dry cough, wheezing, nasal congestion, post nasal drainage.   States he went to his pulmonologist, Dr. Sherene Sires, for the same symptoms 2 weeks ago and states he prescribed me a Z-pak and that "never works for my condition". States he was also prescribed "low dose steroids and this never works". States he requires high dose steroids for much longer. He is also requesting hydrocodone cough syrup.  He is still smoking.   Denies fever, chills, ear pain, rhinorrhea, chest pain, shortness of breath, abdominal pain, N/V/D.   He reports good compliance with Symbicort. States he has needing the albuterol more often. Has not used nebulizer.   Treatment to date:  Antibiotics, steroids, COPD medications.  Denies sick contacts.  No other aggravating or relieving factors.  No other c/o.  ROS as in subjective.   Objective: Vitals:   04/22/17 1605  BP: 110/78  Pulse: (!) 112  Resp: 16  Temp: 98.7 F (37.1 C)  SpO2: 97%    General appearance: Alert, WD/WN, no distress, mildly ill appearing                             Skin: warm, no rash                           Head: no sinus tenderness                            Eyes: conjunctiva normal, corneas clear, PERRLA                            Ears: pearly TMs, external ear canals normal                          Nose: septum midline, turbinates swollen, with erythema and clear discharge             Mouth/throat: MMM, tongue normal, mild pharyngeal erythema                           Neck: supple, no adenopathy, no thyromegaly, nontender                          Heart: RRR, normal S1, S2, no murmurs                         Lungs: CTA bilaterally, no wheezes, rales, or rhonchi. No increased work of breathing      Assessment: Cough -  Plan: doxycycline (VIBRA-TABS) 100 MG tablet, promethazine-dextromethorphan (PROMETHAZINE-DM) 6.25-15 MG/5ML syrup  COPD GOLD 0/1 still smoking - Plan: doxycycline (VIBRA-TABS) 100 MG tablet  Cigarette smoker    Plan: He is not in any respiratory distress.  He did have an appointment with his pulmonologist today however he canceled and came here.  He Patient is adamant that he needs high dose oral steroids and doxycycline today.  He is also requesting codeine cough syrup.  He is quite agitated  and states if he does not get these things he is going to end up in the "fucking ER".  Discussed that I will send in Doxycycline and will prescribe him Promethazine DM for cough and that this is sedating. Advised that he is not wheezing, lungs sounds clear and no increased work of breathing. Reassured him that there is no indication for steroid use especially since he did have a course of steroids only 2 weeks ago. Advised him that stopping smoking would be beneficial. He is not interested. Advised him to call his pulmonologist to follow up. He continued using profanity and I left the room to avoid further confrontation.  The patient and his girlfriend lingered in the room and then the parking lot before leaving. Discussed with my office manager that he did display threatening behavior.

## 2017-04-22 NOTE — Telephone Encounter (Signed)
Called patient, unable to reach left message x1 to give us a call back.

## 2017-04-23 ENCOUNTER — Encounter: Payer: Self-pay | Admitting: Family Medicine

## 2017-04-23 NOTE — Telephone Encounter (Signed)
Called patient, unable to reach left message to give us a call back. x2  

## 2017-04-24 ENCOUNTER — Encounter: Payer: Self-pay | Admitting: Family Medicine

## 2017-04-24 NOTE — Telephone Encounter (Signed)
Called and spoke to pt's wife, Misty StanleyLisa. Misty StanleyLisa states that Rx is not needed, as pt had went to UC on 3/5 and was prescribed prednisone for COPD exacerbation. Nothing further is needed.

## 2017-04-28 ENCOUNTER — Encounter: Payer: Self-pay | Admitting: Family Medicine

## 2017-04-30 ENCOUNTER — Encounter: Payer: Self-pay | Admitting: Family Medicine

## 2017-05-21 ENCOUNTER — Ambulatory Visit: Payer: Self-pay | Admitting: Internal Medicine

## 2017-06-09 ENCOUNTER — Ambulatory Visit (INDEPENDENT_AMBULATORY_CARE_PROVIDER_SITE_OTHER): Payer: Commercial Managed Care - PPO | Admitting: Internal Medicine

## 2017-06-09 ENCOUNTER — Ambulatory Visit: Payer: Commercial Managed Care - PPO | Admitting: Internal Medicine

## 2017-06-09 ENCOUNTER — Encounter: Payer: Self-pay | Admitting: Internal Medicine

## 2017-06-09 VITALS — BP 122/72 | HR 97 | Ht 65.0 in | Wt 138.0 lb

## 2017-06-09 DIAGNOSIS — J449 Chronic obstructive pulmonary disease, unspecified: Secondary | ICD-10-CM | POA: Diagnosis not present

## 2017-06-09 DIAGNOSIS — F17210 Nicotine dependence, cigarettes, uncomplicated: Secondary | ICD-10-CM

## 2017-06-09 DIAGNOSIS — R05 Cough: Secondary | ICD-10-CM | POA: Diagnosis not present

## 2017-06-09 DIAGNOSIS — R053 Chronic cough: Secondary | ICD-10-CM | POA: Insufficient documentation

## 2017-06-09 DIAGNOSIS — R059 Cough, unspecified: Secondary | ICD-10-CM

## 2017-06-09 LAB — PULMONARY FUNCTION TEST
DL/VA % pred: 84 %
DL/VA: 3.64 ml/min/mmHg/L
DLCO UNC: 25.35 ml/min/mmHg
DLCO unc % pred: 99 %
FEF 25-75 Post: 2.78 L/sec
FEF 25-75 Pre: 2.65 L/sec
FEF2575-%Change-Post: 4 %
FEF2575-%PRED-POST: 80 %
FEF2575-%Pred-Pre: 76 %
FEV1-%CHANGE-POST: 1 %
FEV1-%PRED-PRE: 104 %
FEV1-%Pred-Post: 106 %
FEV1-POST: 3.81 L
FEV1-Pre: 3.75 L
FEV1FVC-%Change-Post: 3 %
FEV1FVC-%Pred-Pre: 89 %
FEV6-%Change-Post: -1 %
FEV6-%PRED-POST: 117 %
FEV6-%PRED-PRE: 119 %
FEV6-POST: 5.11 L
FEV6-PRE: 5.19 L
FEV6FVC-%CHANGE-POST: 0 %
FEV6FVC-%PRED-POST: 102 %
FEV6FVC-%PRED-PRE: 101 %
FVC-%Change-Post: -2 %
FVC-%PRED-POST: 115 %
FVC-%Pred-Pre: 117 %
FVC-Post: 5.15 L
FVC-Pre: 5.26 L
PRE FEV6/FVC RATIO: 99 %
Post FEV1/FVC ratio: 74 %
Post FEV6/FVC ratio: 99 %
Pre FEV1/FVC ratio: 71 %
RV % PRED: 137 %
RV: 2.17 L
TLC % PRED: 122 %
TLC: 7.27 L

## 2017-06-09 NOTE — Patient Instructions (Addendum)
Your lung function is normal -  The key is to stop smoking completely before smoking completely stops you!    Work on inhaler technique:  relax and gently blow all the way out then take a nice smooth deep breath back in, triggering the inhaler at same time you start breathing in.  Hold for up to 5 seconds if you can. Blow out thru nose. Rinse and gargle with water when done   Please see patient coordinator before you leave today  to schedule sinus CT   Please schedule a follow up visit in 3 months but call sooner if needed

## 2017-06-09 NOTE — Progress Notes (Signed)
PFT done today. 

## 2017-06-09 NOTE — Progress Notes (Signed)
Subjective:     Patient ID: Dustin Steele, male   DOB: 1975/11/21, [age] y.o.   MRN: [id number]    Brief patient profile:  89 [place]ow[place]  [place] "bronchitis" with 02 but no vent with no need for inhalers in middle school or HS and good ex tol (soccer ) and could run a  Mile in 6 : 10 sec but by mid 20s noted doe and need for maint rx since age 42 = spiriva/advair dpi's referred to pulmonary clinic 01/17/2017 by [name]    History of Present Illness  01/17/2017 1st [facility] office visit/    Chief Complaint  Patient presents with  . Follow-up    consult for COPD exacerbations up to 3 times a year. 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  Work on inhaler technique:    Plan B = Backup Only use your albuterol as a rescue medication Plan C = Crisis - only use your albuterol nebulizer if you first try Plan B    04/09/2017  f/u ov/ re:  Still smoking   -  Thinking about starting propranolol for nerves per psych Chief Complaint  Patient presents with  . Follow-up    dry cough, SOB, chest discomort, sneezing, no post nasal drip,   Baseline Dyspnea:  No limits at baseline Cough: no Sleep: ok No need for any alb Then starting 04/06/17 scratch throat / nasal congestion > then dry hacking cough, gen ant cp with coughing only  wants cough syrup rec No change on symbicort 160 Take 2 puffs first thing in am and then another 2 puffs about 12 hours later.  Work on  inhaler technique: zpak / Prednisone 10 mg take  4 each am x 2 days,   2 each am x 2 days,  1 each am x 2 days and stop     06/09/2017  f/u ov/ re: copd GOLD 0  - still smoking / maint on symb 160 2bid  Chief Complaint  Patient presents with  . Follow-up    Doing okay nasal congestion , SOB with acivity   Dyspnea: really not limited by breathing from desired activities  And rarely does any real exercise  Cough: only with flares with uri's > only once in last 2 months Sleep: ok  SABA use:  Only with flares "always starts out as head cold"  (was not blowing symb out thru nose as rec   No obvious day to day or daytime variability or assoc excess/ purulent sputum or mucus plugs or hemoptysis or cp or chest tightness, subjective wheeze or overt   hb symptoms. No unusual exposure hx or h/o childhood pna/ asthma or knowledge of premature birth.  Sleeping  Flat  without nocturnal  or early am exacerbation  of respiratory  c/o's or need for noct saba. Also denies any obvious fluctuation of symptoms with weather or environmental  changes or other aggravating or alleviating factors except as outlined above   Current Allergies, Complete Past Medical History, Past Surgical History, Family History, and Social History were reviewed in [employer] [facility] record.  ROS  The following are not active complaints unless bolded Hoarseness, sore throat, dysphagia, dental problems, itching, sneezing,  nasal congestion or discharge of excess mucus or purulent secretions, ear ache,   fever, chills, sweats, unintended wt loss or wt gain, classically pleuritic or exertional cp,  orthopnea pnd or arm/hand swelling  or leg swelling, presyncope, palpitations, abdominal pain, anorexia, nausea, vomiting, diarrhea  or change in bowel habits or change in bladder habits, change in stools or change in urine, dysuria, hematuria,  rash, arthralgias, visual complaints, headache, numbness, weakness or ataxia or  problems with walking or coordination,  change in mood or  memory.        Current Meds  Medication Sig  . albuterol (PROVENTIL HFA;VENTOLIN HFA) 108 (90 Base) MCG/ACT inhaler Inhale 2 puffs into the lungs every 6 (six) hours as needed for wheezing or shortness of breath.  Marland Kitchen albuterol (PROVENTIL) (2.5 MG/3ML) 0.083% nebulizer solution Take 3 mLs (2.5 mg total) by nebulization every 6 (six) hours as needed for wheezing or shortness of breath.  . budesonide-formoterol (SYMBICORT) 160-4.5 MCG/ACT inhaler Inhale 2 puffs into the lungs 2 (two) times daily.  . clonazePAM (KLONOPIN) 0.5 MG tablet Take 1 tablet (0.5 mg total) by mouth 3 (three) times daily.  . nicotine (NICODERM CQ - DOSED IN MG/24 HOURS) 21 mg/24hr patch Place 1 patch (21 mg total) onto the skin daily. To stop smoking  . venlafaxine XR (EFFEXOR-XR) 150 MG 24 hr capsule Take 1 capsule (150 mg total) by mouth daily. For mood control (Patient taking differently: Take 300 mg by mouth daily. For mood control)                       Objective:   Physical Exam     amb wm nad   06/09/2017       138   04/09/2017      144   01/17/17 130 lb 3.2 oz (59.1 kg)  12/11/16 135 lb (61.2 kg)  10/17/16 147 lb (66.7 kg)    Vital signs reviewed - Note on arrival 02 sats  100% on RA       HEENT: nl  turbinates bilaterally, and oropharynx. Nl external ear canals without cough reflex - poor dentition   NECK :  without JVD/Nodes/TM/ nl carotid upstrokes bilaterally   LUNGS: no acc muscle use,  Nl contour chest with minimal insp/ exp rhonchi  bilaterally without cough on insp or exp maneuvers   CV:  RRR  no s3 or murmur or increase in P2, and no edema   ABD:  soft and nontender with nl inspiratory excursion in the supine position. No bruits or organomegaly appreciated, bowel sounds nl  MS:  Nl gait/ ext warm without deformities, calf tenderness, cyanosis or clubbing No obvious joint restrictions   SKIN: warm and dry without  lesions    NEURO:  alert, approp, nl sensorium with  no motor or cerebellar deficits apparent.                Assessment:

## 2017-06-10 ENCOUNTER — Encounter: Payer: Self-pay | Admitting: Internal Medicine

## 2017-06-10 NOTE — Assessment & Plan Note (Addendum)
Spirometry 01/17/2017  FEV1 3.47 (96%)  Ratio 70  With mild / mod curvature and < 6 sec exp - Allergy profile 01/17/2017 >  Eos 0.0 /  IgE  131 with RAST pos dust > grass , ragweed, tree, dog  - Alpha one AT screening 01/17/2017   MM level 144  - 01/17/2017    try off dpi's and use symb 160 2bid    - PFT's  06/09/2017  FEV1 3.81 (106 % ) ratio 74  p 1 % improvement from saba p symbicort 160  prior to study with DLCO  99 % corrects to 84 % for alv volume    - 06/09/2017  After extensive coaching inhaler device  effectiveness =    75% (Ti still too short)  Despite active smoking he really doesn't have copd and at this point component that is asthma =  All goals of chronic asthma control met including optimal function and elimination of symptoms with minimal need for rescue therapy. Contingencies discussed in full including contacting this office immediately if not controlling the symptoms using the rule of two's.      I had an extended discussion with the patient reviewing all relevant studies completed to date and  lasting 15 to 20 minutes of a 25 minute visit    Each maintenance medication was reviewed in detail including most importantly the difference between maintenance and prns and under what circumstances the prns are to be triggered using an action plan format that is not reflected in the computer generated alphabetically organized AVS.    Please see AVS for specific instructions unique to this visit that I personally wrote and verbalized to the the pt in detail and then reviewed with pt  by my nurse highlighting any  changes in therapy recommended at today's visit to their plan of care.

## 2017-06-10 NOTE — Assessment & Plan Note (Signed)

## 2017-06-10 NOTE — Assessment & Plan Note (Signed)
Since he is certain that his flares "always start in my head and go to my chest"  And has never had ent w/u rec sinus ct and ent f/u prn

## 2017-06-17 ENCOUNTER — Ambulatory Visit (HOSPITAL_BASED_OUTPATIENT_CLINIC_OR_DEPARTMENT_OTHER): Payer: Commercial Managed Care - PPO

## 2017-06-25 ENCOUNTER — Ambulatory Visit (HOSPITAL_BASED_OUTPATIENT_CLINIC_OR_DEPARTMENT_OTHER): Admission: RE | Admit: 2017-06-25 | Payer: Commercial Managed Care - PPO | Source: Ambulatory Visit

## 2017-07-07 ENCOUNTER — Encounter: Payer: Self-pay | Admitting: Internal Medicine

## 2017-08-24 ENCOUNTER — Other Ambulatory Visit: Payer: Self-pay

## 2017-08-24 ENCOUNTER — Encounter (HOSPITAL_COMMUNITY): Payer: Self-pay | Admitting: *Deleted

## 2017-08-24 DIAGNOSIS — Z79899 Other long term (current) drug therapy: Secondary | ICD-10-CM | POA: Diagnosis not present

## 2017-08-24 DIAGNOSIS — F1110 Opioid abuse, uncomplicated: Secondary | ICD-10-CM | POA: Diagnosis not present

## 2017-08-24 DIAGNOSIS — F17210 Nicotine dependence, cigarettes, uncomplicated: Secondary | ICD-10-CM | POA: Insufficient documentation

## 2017-08-24 DIAGNOSIS — F909 Attention-deficit hyperactivity disorder, unspecified type: Secondary | ICD-10-CM | POA: Diagnosis not present

## 2017-08-24 DIAGNOSIS — J449 Chronic obstructive pulmonary disease, unspecified: Secondary | ICD-10-CM | POA: Diagnosis not present

## 2017-08-24 NOTE — ED Triage Notes (Signed)
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 Feelings of anxiety. Denies SI/HI. Pt wife states the pt slammed his head against the door jam and then punched the door jam. She also says she is unsure if he threw himself down the steps or fell down the steps today, but she heard and saw him at the bottom of the steps. No LOC or vomiting.

## 2017-08-25 ENCOUNTER — Emergency Department (HOSPITAL_COMMUNITY)
Admission: EM | Admit: 2017-08-25 | Discharge: 2017-08-25 | Disposition: A | Discharge status: Home / Self Care | Payer: Commercial Managed Care - PPO | Attending: Emergency Medicine | Admitting: Emergency Medicine

## 2017-08-25 DIAGNOSIS — F1110 Opioid abuse, uncomplicated: Secondary | ICD-10-CM

## 2017-08-25 NOTE — ED Provider Notes (Signed)
[facility]-EMERGENCY DEPT Provider Note   CSN: [id number] Arrival date & time: 08/24/17  2210     History   Chief Complaint Chief Complaint  Patient presents with  . Drug Problem    HPI Dustin Steele is a 42 y.o. male.  42 yo M with a chief complaint of wanting rehab from drugs.  The patient has been taking Suboxone.  He has been doing this because it makes him feel good.  He denies trying to take it to harm himself.  His significant other try to get him to leave the house and he had a anxiety attack and started punching the wall and hit his head against the door jam.  He denies any pain from that denies confusion denies vomiting.  They called his psychiatrist who suggested he come here for evaluation.  He denies suicidal or homicidal ideation.  Denies hallucinations.  The history is provided by the patient.  Drug Problem  This is a new problem. The current episode started more than 1 week ago. The problem has not changed since onset.Pertinent negatives include no chest pain, no abdominal pain, no headaches and no shortness of breath. Nothing aggravates the symptoms. Nothing relieves the symptoms. He has tried nothing for the symptoms. The treatment provided no relief.    Past Medical History:  Diagnosis Date  . ADHD   . Anxiety   . 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 COPD (chronic obstructive pulmonary disease) (HCC)   . Nicotine dependence   . Panic disorder     Patient Active Problem List   Diagnosis Date Noted  . Chronic cough 06/09/2017  . GAD (generalized anxiety disorder) 01/21/2017  . MDD (major depressive disorder) 01/19/2017  . Cigarette smoker 01/17/2017  . Abnormal PFT 09/23/2016  . COPD GOLD 0/ ? AB still smoking    . Nicotine dependence   . Anxiety   . ADHD   . Panic disorder     Past Surgical History:  Procedure Laterality Date  . FEMUR FRACTURE SURGERY    . FEMUR IM ROD REMOVAL          Home Medications    Prior to Admission  medications   Medication Sig Start Date End Date Taking? Authorizing Provider  albuterol (PROVENTIL HFA;VENTOLIN HFA) 108 (90 Base) MCG/ACT inhaler Inhale 2 puffs into the lungs every 6 (six) hours as needed for wheezing or shortness of breath.   Yes [provider]  albuterol (PROVENTIL) (2.5 MG/3ML) 0.083% nebulizer solution Take 3 mLs (2.5 mg total) by nebulization every 6 (six) hours as needed for wheezing or shortness of breath. 10/17/16  Yes Zadie Rhine, MD  budesonide-formoterol [facility]) 160-4.5 MCG/ACT inhaler Inhale 2 puffs into the lungs 2 (two) times daily. 04/09/17  Yes Nyoka Cowden, MD  Buprenorphine HCl-Naloxone HCl (SUBOXONE SL) Place 1 Film under the tongue daily.   Yes [provider]  clonazePAM (KLONOPIN) 0.5 MG tablet Take 1 tablet (0.5 mg total) by mouth 3 (three) times daily. 01/24/17  Yes Money, Gerlene Burdock, FNP  [employer] B-12 500 MCG tablet Take 500 mcg by mouth daily. 07/26/17  Yes [provider]  metFORMIN (GLUCOPHAGE) 500 MG tablet Take 1,000 mg by mouth daily with supper. 08/13/17  Yes [provider]  Pseudoephedrine HCl (PSEUDO PO) Take 2 tablets by mouth every 4 (four) hours.   Yes [provider]  venlafaxine XR (EFFEXOR-XR) 150 MG 24 hr capsule Take 1 capsule (150 mg total) by mouth daily.  For mood control Patient taking differently: Take 300 mg by mouth daily. For mood control 01/25/17  Yes Money, Gerlene Burdockravis B, FNP  VRAYLAR capsule Take 1.5 mg by mouth at bedtime. 07/28/17  Yes [provider]  nicotine (NICODERM CQ - DOSED IN MG/24 HOURS) 21 mg/24hr patch Place 1 patch (21 mg total) onto the skin daily. To stop smoking Patient not taking: Reported on 08/25/2017 01/25/17   Money, Gerlene Burdockravis B, FNP    Family History No family history on file.  Social History Social History   Tobacco Use  . Smoking status: Current Every Day Smoker    Packs/day: 0.75    Years: 20.00    Pack years: 15.00    Types: Cigarettes  .  Smokeless tobacco: Never Used  Substance Use Topics  . Alcohol use: Yes    Alcohol/week: 1.2 oz    Types: 2 Cans of beer per week    Comment: social  . Drug use: Yes    Types: Marijuana     Allergies   Patient has no known allergies.   Review of Systems Review of Systems  Constitutional: Negative for chills and fever.  HENT: Negative for congestion and facial swelling.   Eyes: Negative for discharge and visual disturbance.  Respiratory: Negative for shortness of breath.   Cardiovascular: Negative for chest pain and palpitations.  Gastrointestinal: Negative for abdominal pain, diarrhea and vomiting.  Musculoskeletal: Negative for arthralgias and myalgias.  Skin: Negative for color change and rash.  Neurological: Negative for tremors, syncope and headaches.  Psychiatric/Behavioral: Negative for confusion and dysphoric mood.     Physical Exam Updated Vital Signs BP 108/83   Pulse 66   Temp 98.2 F (36.8 C) (Oral)   Resp 18   SpO2 99%   Physical Exam  Constitutional: He is oriented to person, place, and time. He appears well-developed and well-nourished.  HENT:  Head: Normocephalic and atraumatic.  Eyes: Pupils are equal, round, and reactive to light. EOM are normal.  Neck: Normal range of motion. Neck supple. No JVD present.  Cardiovascular: Normal rate and regular rhythm. Exam reveals no gallop and no friction rub.  No murmur heard. Pulmonary/Chest: No respiratory distress. He has no wheezes.  Abdominal: He exhibits no distension and no mass. There is no tenderness. There is no rebound and no guarding.  Musculoskeletal: Normal range of motion.  Neurological: He is alert and oriented to person, place, and time.  Skin: No rash noted. No pallor.  Psychiatric: He has a normal mood and affect. His behavior is normal.  Nursing note and vitals reviewed.    ED Treatments / Results  Labs (all labs ordered are listed, but only abnormal results are displayed) Labs  Reviewed - No data to display  EKG None  Radiology No results found.  Procedures Procedures (including critical care time)  Medications Ordered in ED Medications - No data to display   Initial Impression / Assessment and Plan / ED Course  I have reviewed the triage vital signs and the nursing notes.  Pertinent labs & imaging results that were available during my care of the patient were reviewed by me and considered in my medical decision making (see chart for details).     42 yo M with a chief complaint of wanting rehab from drug use.  The family actually wants him that if this is an outpatient and not as an inpatient.  I discussed that we would give him resources they were somewhat upset that they had  to wait in the emergency department to get resources.  I do not feel that he needs psychiatric evaluation as he is not suicidal homicidal or actively hallucinating.  I discussed the typically we have drug abuse followed up as an outpatient.  He was given a list of resources.  He has no signs of trauma from the events earlier today.  I do not feel he needs a CT of his head.  12:36 AM:  I have discussed the diagnosis/risks/treatment options with the patient and family and believe the pt to be eligible for discharge home to follow-up with PCP, psych. We also discussed returning to the ED immediately if new or worsening sx occur. We discussed the sx which are most concerning (e.g., sudden worsening pain, fever, inability to tolerate by mouth) that necessitate immediate return. Medications administered to the patient during their visit and any new prescriptions provided to the patient are listed below.  Medications given during this visit Medications - No data to display   The patient appears reasonably screen and/or stabilized for discharge and I doubt any other medical condition or other [facility] requiring further screening, evaluation, or treatment in the ED at this time prior to discharge.     Final Clinical Impressions(s) / ED Diagnoses   Final diagnoses:  Opiate abuse, continuous [facility])    ED Discharge Orders    None       Melene Plan, DO 08/25/17 0036

## 2017-09-08 ENCOUNTER — Ambulatory Visit: Payer: Self-pay | Admitting: Internal Medicine

## 2017-10-23 ENCOUNTER — Emergency Department (HOSPITAL_BASED_OUTPATIENT_CLINIC_OR_DEPARTMENT_OTHER)
Admission: EM | Admit: 2017-10-23 | Discharge: 2017-10-23 | Disposition: A | Discharge status: Home / Self Care | Payer: Commercial Managed Care - PPO | Attending: Emergency Medicine | Admitting: Emergency Medicine

## 2017-10-23 ENCOUNTER — Encounter (HOSPITAL_BASED_OUTPATIENT_CLINIC_OR_DEPARTMENT_OTHER): Payer: Self-pay | Admitting: *Deleted

## 2017-10-23 ENCOUNTER — Other Ambulatory Visit: Payer: Self-pay

## 2017-10-23 DIAGNOSIS — Z5321 Procedure and treatment not carried out due to patient leaving prior to being seen by health care provider: Secondary | ICD-10-CM | POA: Insufficient documentation

## 2017-10-23 DIAGNOSIS — R0789 Other chest pain: Secondary | ICD-10-CM | POA: Insufficient documentation

## 2017-10-23 NOTE — ED Triage Notes (Signed)
Panic attack earlier tonight. States he usually gets muscle pain that goes away. Tonight he has had pain in his left chest x 2  Hours since the panic attack subsided.

## 2017-10-28 ENCOUNTER — Other Ambulatory Visit: Payer: Self-pay

## 2017-10-28 ENCOUNTER — Encounter (HOSPITAL_BASED_OUTPATIENT_CLINIC_OR_DEPARTMENT_OTHER): Payer: Self-pay

## 2017-10-28 ENCOUNTER — Emergency Department (HOSPITAL_BASED_OUTPATIENT_CLINIC_OR_DEPARTMENT_OTHER): Payer: Commercial Managed Care - PPO

## 2017-10-28 ENCOUNTER — Emergency Department (HOSPITAL_BASED_OUTPATIENT_CLINIC_OR_DEPARTMENT_OTHER)
Admission: EM | Admit: 2017-10-28 | Discharge: 2017-10-28 | Disposition: A | Discharge status: Home / Self Care | Payer: Commercial Managed Care - PPO | Attending: Emergency Medicine | Admitting: Emergency Medicine

## 2017-10-28 DIAGNOSIS — J449 Chronic obstructive pulmonary disease, unspecified: Secondary | ICD-10-CM | POA: Insufficient documentation

## 2017-10-28 DIAGNOSIS — R109 Unspecified abdominal pain: Secondary | ICD-10-CM | POA: Diagnosis present

## 2017-10-28 DIAGNOSIS — R110 Nausea: Secondary | ICD-10-CM | POA: Diagnosis not present

## 2017-10-28 DIAGNOSIS — F17210 Nicotine dependence, cigarettes, uncomplicated: Secondary | ICD-10-CM | POA: Insufficient documentation

## 2017-10-28 DIAGNOSIS — Z79899 Other long term (current) drug therapy: Secondary | ICD-10-CM | POA: Insufficient documentation

## 2017-10-28 HISTORY — DX: Opioid dependence, uncomplicated: F11.20

## 2017-10-28 LAB — CBC
HCT: 42.5 % (ref 39.0–52.0)
Hemoglobin: 14.8 g/dL (ref 13.0–17.0)
MCH: 31.7 pg (ref 26.0–34.0)
MCHC: 34.8 g/dL (ref 30.0–36.0)
MCV: 91 fL (ref 78.0–100.0)
PLATELETS: 171 10*3/uL (ref 150–400)
RBC: 4.67 MIL/uL (ref 4.22–5.81)
RDW: 12.6 % (ref 11.5–15.5)
WBC: 6.7 10*3/uL (ref 4.0–10.5)

## 2017-10-28 LAB — COMPREHENSIVE METABOLIC PANEL
ALT: 14 U/L (ref 0–44)
AST: 19 U/L (ref 15–41)
Albumin: 4.3 g/dL (ref 3.5–5.0)
Alkaline Phosphatase: 41 U/L (ref 38–126)
Anion gap: 10 (ref 5–15)
BUN: 9 mg/dL (ref 6–20)
CHLORIDE: 98 mmol/L (ref 98–111)
CO2: 28 mmol/L (ref 22–32)
Calcium: 9.3 mg/dL (ref 8.9–10.3)
Creatinine, Ser: 0.67 mg/dL (ref 0.61–1.24)
Glucose, Bld: 100 mg/dL — ABNORMAL HIGH (ref 70–99)
POTASSIUM: 3.6 mmol/L (ref 3.5–5.1)
SODIUM: 136 mmol/L (ref 135–145)
Total Bilirubin: 0.6 mg/dL (ref 0.3–1.2)
Total Protein: 6.9 g/dL (ref 6.5–8.1)

## 2017-10-28 LAB — URINALYSIS, ROUTINE W REFLEX MICROSCOPIC
Bilirubin Urine: NEGATIVE
GLUCOSE, UA: NEGATIVE mg/dL
HGB URINE DIPSTICK: NEGATIVE
Ketones, ur: NEGATIVE mg/dL
Leukocytes, UA: NEGATIVE
Nitrite: NEGATIVE
PROTEIN: NEGATIVE mg/dL
SPECIFIC GRAVITY, URINE: 1.01 (ref 1.005–1.030)
pH: 6 (ref 5.0–8.0)

## 2017-10-28 LAB — TROPONIN I: Troponin I: <0.03 ng/mL (ref <0.03)

## 2017-10-28 LAB — LIPASE, BLOOD: Lipase: 26 U/L (ref 11–51)

## 2017-10-28 MED ORDER — ONDANSETRON 4 MG PO TBDP: 4.0000 mg | ORAL_TABLET | Freq: Three times a day (TID) | ORAL | 0 refills | Status: DC | PRN

## 2017-10-28 MED ORDER — ONDANSETRON HCL 4 MG/2ML IJ SOLN
4.0000 mg | Freq: Once | INTRAMUSCULAR | Status: AC | PRN
  Administered 2017-10-28: 4 mg via INTRAVENOUS
  Filled 2017-10-28: qty 2

## 2017-10-28 NOTE — ED Provider Notes (Signed)
[facility] EMERGENCY DEPARTMENT Provider Note   CSN: [id number] Arrival date & time: 10/28/17  0139     History   Chief Complaint Chief Complaint  Patient presents with  . Abdominal Pain    HPI Dustin Steele is a 42 y.o. male.  HPI  This is a 42 year old male with a history of COPD, anxiety who presents with nausea.  Patient reports 1 day history of nausea.  He states that this is kept him from eating.  Denies any vomiting.  He reports intermittent right upper quadrant pain.  Not associated with food.  States the pain is sharp and nonradiating.  Nothing seems to make it better or worse.  Currently he is without pain.  He is only experiencing nausea.  Denies any chest pain or shortness of breath.  Reports that he was at the ED several days ago but left without being seen.  He reports at that time he was having some chest pain associated with anxiety.  Denies any recent fevers or sick contacts.  Reports normal bowel movements.  No diarrhea.  Past Medical History:  Diagnosis Date  . ADHD   . Anxiety   . 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 COPD (chronic obstructive pulmonary disease) (HCC)   . Nicotine dependence   . Opiate dependence (HCC)   . Panic disorder     Patient Active Problem List   Diagnosis Date Noted  . Chronic cough 06/09/2017  . GAD (generalized anxiety disorder) 01/21/2017  . MDD (major depressive disorder) 01/19/2017  . Cigarette smoker 01/17/2017  . Abnormal PFT 09/23/2016  . COPD GOLD 0/ ? AB still smoking    . Nicotine dependence   . Anxiety   . ADHD   . Panic disorder     Past Surgical History:  Procedure Laterality Date  . FEMUR FRACTURE SURGERY    . FEMUR IM ROD REMOVAL          Home Medications    Prior to Admission medications   Medication Sig Start Date End Date Taking? Authorizing Provider  albuterol (PROVENTIL HFA;VENTOLIN HFA) 108 (90 Base) MCG/ACT inhaler Inhale 2 puffs into the lungs every 6 (six) hours as needed for wheezing  or shortness of breath.    [provider]  albuterol (PROVENTIL) (2.5 MG/3ML) 0.083% nebulizer solution Take 3 mLs (2.5 mg total) by nebulization every 6 (six) hours as needed for wheezing or shortness of breath. 10/17/16   Zadie Rhine, MD  budesonide-formoterol [facility]) 160-4.5 MCG/ACT inhaler Inhale 2 puffs into the lungs 2 (two) times daily. 04/09/17   Nyoka Cowden, MD  Buprenorphine HCl-Naloxone HCl (SUBOXONE SL) Place 1 Film under the tongue daily.    [provider]  clonazePAM (KLONOPIN) 0.5 MG tablet Take 1 tablet (0.5 mg total) by mouth 3 (three) times daily. 01/24/17   Money, Gerlene Burdock, FNP  [employer] B-12 500 MCG tablet Take 500 mcg by mouth daily. 07/26/17   [provider]  metFORMIN (GLUCOPHAGE) 500 MG tablet Take 1,000 mg by mouth daily with supper. 08/13/17   [provider]  nicotine (NICODERM CQ - DOSED IN MG/24 HOURS) 21 mg/24hr patch Place 1 patch (21 mg total) onto the skin daily. To stop smoking Patient not taking: Reported on 08/25/2017 01/25/17   Money, Gerlene Burdock, FNP  ondansetron (ZOFRAN ODT) 4 MG disintegrating tablet Take 1 tablet (4 mg total) by mouth every 8 (eight) hours as needed for nausea or vomiting. 10/28/17   Celie Desrochers, Mayer Masker,  MD  Pseudoephedrine HCl (PSEUDO PO) Take 2 tablets by mouth every 4 (four) hours.    [provider]  venlafaxine XR (EFFEXOR-XR) 150 MG 24 hr capsule Take 1 capsule (150 mg total) by mouth daily. For mood control Patient taking differently: Take 300 mg by mouth daily. For mood control 01/25/17   Money, [place] B, FNP  VRAYLAR capsule Take 1.5 mg by mouth at bedtime. 07/28/17   [provider]    Family History No family history on file.  Social History Social History   Tobacco Use  . Smoking status: Current Every Day Smoker    Packs/day: 0.75    Years: 20.00    Pack years: 15.00    Types: Cigarettes  . Smokeless tobacco: Never Used  Substance Use Topics  . Alcohol use: Yes     Alcohol/week: 2.0 standard drinks    Types: 2 Cans of beer per week    Comment: social  . Drug use: Yes    Types: Marijuana     Allergies   Patient has no known allergies.   Review of Systems Review of Systems  Constitutional: Negative for fever.  Respiratory: Negative for shortness of breath.   Cardiovascular: Negative for chest pain.  Gastrointestinal: Positive for abdominal pain and nausea. Negative for constipation, diarrhea and vomiting.  Genitourinary: Negative for dysuria.  Neurological: Negative for dizziness.  All other systems reviewed and are negative.    Physical Exam Updated Vital Signs BP 125/89 (BP Location: Left Arm)   Pulse 83   Temp 98.2 F (36.8 C) (Oral)   Resp 18   Ht 1.651 m (5\' 5" )   Wt 63.5 kg   SpO2 100%   BMI 23.30 kg/m   Physical Exam  Constitutional: He is oriented to person, place, and time. He appears well-developed and well-nourished.  Non-toxic appearance.  HENT:  Head: Normocephalic and atraumatic.  Eyes: Pupils are equal, round, and reactive to light.  Neck: Neck supple.  Cardiovascular: Normal rate, regular rhythm and normal heart sounds.  No murmur heard. Pulmonary/Chest: Effort normal. No respiratory distress. He has wheezes.  Occasional wheeze  Abdominal: Soft. Bowel sounds are normal. There is no tenderness. There is no rebound and no guarding.  Musculoskeletal: He exhibits no edema.  Lymphadenopathy:    He has no cervical adenopathy.  Neurological: He is alert and oriented to person, place, and time.  Skin: Skin is warm and dry.  Psychiatric: He has a normal mood and affect.  Nursing note and vitals reviewed.    ED Treatments / Results  Labs (all labs ordered are listed, but only abnormal results are displayed) Labs Reviewed  COMPREHENSIVE METABOLIC PANEL - Abnormal; Notable for the following components:      Result Value   Glucose, Bld 100 (*)    All other components within normal limits  LIPASE, BLOOD  CBC    URINALYSIS, ROUTINE W REFLEX MICROSCOPIC  TROPONIN I    EKG EKG Interpretation  Date/Time:  Tuesday October 28 2017 03:22:18 EDT Ventricular Rate:  59 PR Interval:    QRS Duration: 114 QT Interval:  453 QTC Calculation: 449 R Axis:   93 Text Interpretation:  Sinus rhythm ST elev, probable normal early repol pattern No significant change since last tracing Confirmed by Ross Marcus ([id number]) on 10/28/2017 4:03:38 AM   Radiology Dg Abdomen Acute W/chest  Result Date: 10/28/2017 CLINICAL DATA:  Abdominal pain.  Nausea. EXAM: DG ABDOMEN ACUTE W/ 1V CHEST COMPARISON:  Chest radiograph 10/11/2016 FINDINGS:  Chronic hyperinflation. Normal heart size and mediastinal contours. No focal airspace disease, pleural effusion or pneumothorax. Bilateral nipple rings noted. No free intra-abdominal air. No bowel dilatation to suggest obstruction. Small to moderate colonic stool burden. Air in stool in the rectum without abnormal rectal distention. No radiopaque calculi. Right pelvic phlebolith. No acute osseous abnormalities are seen. IMPRESSION: 1. Normal bowel gas pattern. No evidence of bowel obstruction or free air. 2. Chronic lung hyperinflation without acute chest finding. Electronically Signed   By: Narda Rutherford M.D.   On: 10/28/2017 03:52    Procedures Procedures (including critical care time)  Medications Ordered in ED Medications  ondansetron (ZOFRAN) injection 4 mg (4 mg Intravenous Given 10/28/17 0253)     Initial Impression / Assessment and Plan / ED Course  I have reviewed the triage vital signs and the nursing notes.  Pertinent labs & imaging results that were available during my care of the patient were reviewed by me and considered in my medical decision making (see chart for details).     She presents with predominantly nausea.  Is overall nontoxic-appearing and vital signs are reassuring.  Also reports some intermittent right upper quadrant pain.  No reproducible pain  on exam.  He is otherwise well-appearing.  Basic lab work obtained.  Patient given nausea medication.  Lab work-up including CBC, CMP, and lipase is negative.  While gallbladder pathology is a consideration, given absence of tenderness on exam, do not feel inclined that he needs an emergent ultrasound.  Acute abdominal series shows no evidence of air-fluid levels or obstruction.  Patient feels better after Zofran.  He is able to orally hydrate.  Will discharge home with short course of Zofran.  After history, exam, and medical workup I feel the patient has been appropriately medically screened and is safe for discharge home. Pertinent diagnoses were discussed with the patient. Patient was given return precautions.   Final Clinical Impressions(s) / ED Diagnoses   Final diagnoses:  Nausea in adult    ED Discharge Orders         Ordered    ondansetron (ZOFRAN ODT) 4 MG disintegrating tablet  Every 8 hours PRN     10/28/17 0424           Maxten Shuler, Mayer Masker, MD 10/28/17 [phone]

## 2017-10-28 NOTE — ED Triage Notes (Signed)
RUQ pain and nausea that started a few hours ago

## 2017-10-28 NOTE — ED Notes (Signed)
Sent pt to restroom to give a urine sample, he seems very reluctant to do so. States "I doubt I'll be able to piss without being able to drink something." Informed pt that with abdominal pain and nausea, he is not to drink anything until the EDP states that he can. Gave pt a cup to try to urinate while he is waiting for a room.

## 2017-10-28 NOTE — Discharge Instructions (Addendum)
You were seen today for nausea.  Your work-up is reassuring.  Take medications as prescribed.  If you develop significant pain, inability to stay hydrated, any new or worsening symptoms you should be reevaluated.

## 2017-10-30 ENCOUNTER — Emergency Department (HOSPITAL_BASED_OUTPATIENT_CLINIC_OR_DEPARTMENT_OTHER): Payer: Commercial Managed Care - PPO

## 2017-10-30 ENCOUNTER — Encounter (HOSPITAL_BASED_OUTPATIENT_CLINIC_OR_DEPARTMENT_OTHER): Payer: Self-pay

## 2017-10-30 ENCOUNTER — Emergency Department (HOSPITAL_BASED_OUTPATIENT_CLINIC_OR_DEPARTMENT_OTHER)
Admission: EM | Admit: 2017-10-30 | Discharge: 2017-10-31 | Disposition: A | Discharge status: Home / Self Care | Payer: Commercial Managed Care - PPO | Attending: Emergency Medicine | Admitting: Emergency Medicine

## 2017-10-30 ENCOUNTER — Other Ambulatory Visit: Payer: Self-pay

## 2017-10-30 DIAGNOSIS — Z7984 Long term (current) use of oral hypoglycemic drugs: Secondary | ICD-10-CM | POA: Insufficient documentation

## 2017-10-30 DIAGNOSIS — F17210 Nicotine dependence, cigarettes, uncomplicated: Secondary | ICD-10-CM | POA: Insufficient documentation

## 2017-10-30 DIAGNOSIS — Z79899 Other long term (current) drug therapy: Secondary | ICD-10-CM | POA: Diagnosis not present

## 2017-10-30 DIAGNOSIS — J449 Chronic obstructive pulmonary disease, unspecified: Secondary | ICD-10-CM | POA: Insufficient documentation

## 2017-10-30 DIAGNOSIS — R05 Cough: Secondary | ICD-10-CM | POA: Diagnosis present

## 2017-10-30 MED ORDER — PREDNISONE 50 MG PO TABS
60.0000 mg | ORAL_TABLET | Freq: Once | ORAL | Status: AC
  Administered 2017-10-30: 60 mg via ORAL
  Filled 2017-10-30: qty 1

## 2017-10-30 NOTE — ED Notes (Signed)
Patient transported to X-ray 

## 2017-10-30 NOTE — ED Provider Notes (Signed)
[facility] EMERGENCY DEPARTMENT Provider Note   CSN: [id number] Arrival date & time: 10/30/17  2142     History   Chief Complaint Chief Complaint  Patient presents with  . Cough    HPI Dustin Steele is a 42 y.o. male.  The history is provided by the patient.  Cough  This is a recurrent problem. The current episode started yesterday. The problem occurs constantly. The problem has not changed since onset.The cough is non-productive. There has been no fever. Associated symptoms include wheezing. Pertinent negatives include no chest pain. He has tried nothing for the symptoms. The treatment provided no relief. He is a smoker. His past medical history is significant for COPD.  Was trying to run outside.  Wheezing and coughing.    Past Medical History:  Diagnosis Date  . ADHD   . Anxiety   . 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 COPD (chronic obstructive pulmonary disease) (HCC)   . Nicotine dependence   . Opiate dependence (HCC)   . Panic disorder     Patient Active Problem List   Diagnosis Date Noted  . Chronic cough 06/09/2017  . GAD (generalized anxiety disorder) 01/21/2017  . MDD (major depressive disorder) 01/19/2017  . Cigarette smoker 01/17/2017  . Abnormal PFT 09/23/2016  . COPD GOLD 0/ ? AB still smoking    . Nicotine dependence   . Anxiety   . ADHD   . Panic disorder     Past Surgical History:  Procedure Laterality Date  . FEMUR FRACTURE SURGERY    . FEMUR IM ROD REMOVAL          Home Medications    Prior to Admission medications   Medication Sig Start Date End Date Taking? Authorizing Provider  Armodafinil (NUVIGIL) 250 MG tablet Take 250 mg by mouth daily.   Yes [provider]  venlafaxine XR (EFFEXOR-XR) 75 MG 24 hr capsule Take 75 mg by mouth daily at 2 PM.   Yes [provider]  albuterol (PROVENTIL HFA;VENTOLIN HFA) 108 (90 Base) MCG/ACT inhaler Inhale 2 puffs into the lungs every 6 (six) hours as needed for wheezing or  shortness of breath.    [provider]  albuterol (PROVENTIL) (2.5 MG/3ML) 0.083% nebulizer solution Take 3 mLs (2.5 mg total) by nebulization every 6 (six) hours as needed for wheezing or shortness of breath. 10/17/16   Zadie Rhine, MD  budesonide-formoterol [facility]) 160-4.5 MCG/ACT inhaler Inhale 2 puffs into the lungs 2 (two) times daily. 04/09/17   Nyoka Cowden, MD  Buprenorphine HCl-Naloxone HCl (SUBOXONE SL) Place 1 Film under the tongue daily.    [provider]  clonazePAM (KLONOPIN) 0.5 MG tablet Take 1 tablet (0.5 mg total) by mouth 3 (three) times daily. 01/24/17   Money, Gerlene Burdock, FNP  [facility] B-12 500 MCG tablet Take 500 mcg by mouth daily. 07/26/17   [provider]  metFORMIN (GLUCOPHAGE) 500 MG tablet Take 1,000 mg by mouth daily with supper. 08/13/17   [provider]  venlafaxine XR (EFFEXOR-XR) 150 MG 24 hr capsule Take 1 capsule (150 mg total) by mouth daily. For mood control Patient taking differently: Take 300 mg by mouth daily. For mood control 01/25/17   Money, Edgewood B, FNP  VRAYLAR capsule Take 1.5 mg by mouth at bedtime. 07/28/17   [provider]    Family History No family history on file.  Social History Social History   Tobacco Use  . Smoking status: Current Every  Day Smoker    Packs/day: 0.75    Years: 20.00    Pack years: 15.00    Types: Cigarettes  . Smokeless tobacco: Never Used  Substance Use Topics  . Alcohol use: Not Currently  . Drug use: Not Currently    Types: Marijuana     Allergies   Patient has no known allergies.   Review of Systems Review of Systems  Constitutional: Negative for diaphoresis and fever.  Respiratory: Positive for cough and wheezing.   Cardiovascular: Negative for chest pain, palpitations and leg swelling.  All other systems reviewed and are negative.    Physical Exam Updated Vital Signs BP 135/84 (BP Location: Left Arm)   Pulse 98   Temp 98.3 F (36.8 C) (Oral)    Resp 20   Ht 5\' 5"  (1.651 m)   Wt 61.1 kg   SpO2 99%   BMI 22.43 kg/m   Physical Exam  Constitutional: He is oriented to person, place, and time. He appears well-developed and well-nourished. No distress.  HENT:  Head: Normocephalic and atraumatic.  Mouth/Throat: No oropharyngeal exudate.  Eyes: Pupils are equal, round, and reactive to light. Conjunctivae are normal.  Neck: Normal range of motion. Neck supple.  Cardiovascular: Normal rate, regular rhythm, normal heart sounds and intact distal pulses.  Pulmonary/Chest: Effort normal and breath sounds normal. No stridor. He has no wheezes. He has no rales.  Abdominal: Soft. Bowel sounds are normal. He exhibits no mass. There is no tenderness. There is no rebound and no guarding.  Musculoskeletal: Normal range of motion.  Neurological: He is alert and oriented to person, place, and time. He displays normal reflexes.  Skin: Skin is warm and dry. Capillary refill takes less than 2 seconds.  Psychiatric: He has a normal mood and affect.     ED Treatments / Results  Labs (all labs ordered are listed, but only abnormal results are displayed) Results for orders placed or performed during the hospital encounter of 10/30/17  Troponin I  Result Value Ref Range   Troponin I <0.03 <0.03 ng/mL   Dg Chest 2 View  Result Date: 10/30/2017 CLINICAL DATA:  Cough, shortness of breath and congestion, sudden onset tonight, COPD, anxiety, smoker EXAM: CHEST - 2 VIEW COMPARISON:  10/28/2017 FINDINGS: Normal heart size, mediastinal contours, and pulmonary vascularity. Lungs hyperinflated but clear. No pulmonary infiltrate, pleural effusion, or pneumothorax. No acute osseous findings. Jewelry artifacts project over the lower lungs bilaterally. IMPRESSION: Hyperinflated lungs without acute infiltrate. Electronically Signed   By: Ulyses SouthwardMark  Boles M.D.   On: 10/30/2017 23:34   Dg Abdomen Acute W/chest  Result Date: 10/28/2017 CLINICAL DATA:  Abdominal pain.   Nausea. EXAM: DG ABDOMEN ACUTE W/ 1V CHEST COMPARISON:  Chest radiograph 10/11/2016 FINDINGS: Chronic hyperinflation. Normal heart size and mediastinal contours. No focal airspace disease, pleural effusion or pneumothorax. Bilateral nipple rings noted. No free intra-abdominal air. No bowel dilatation to suggest obstruction. Small to moderate colonic stool burden. Air in stool in the rectum without abnormal rectal distention. No radiopaque calculi. Right pelvic phlebolith. No acute osseous abnormalities are seen. IMPRESSION: 1. Normal bowel gas pattern. No evidence of bowel obstruction or free air. 2. Chronic lung hyperinflation without acute chest finding. Electronically Signed   By: Narda RutherfordMelanie  Sanford M.D.   On: 10/28/2017 03:52    EKG  Date: 10/30/2017  Rate:83  Rhythm: normal sinus rhythm  QRS Axis: normal  Intervals: normal  ST/T Wave abnormalities: normal  Conduction Disutrbances: none  Narrative Interpretation: unremarkable  Procedures Procedures (including critical care time)  Medications Ordered in ED Medications  predniSONE (DELTASONE) tablet 60 mg (has no administration in time range)     Listened to by both myself and RT and no wheezing.     PERC negative wells 0 highly doubt PE in this low risk patient.   Final Clinical Impressions(s) / ED Diagnoses  Will discharge with inhaler and steroids.    Return for fevers >100.4 unrelieved by medication, shortness of breath, intractable vomiting, or diarrhea, Inability to tolerate liquids or food, cough, altered mental status or any concerns. No signs of systemic illness or infection. The patient is nontoxic-appearing on exam and vital signs are within normal limits.   I have reviewed the triage vital signs and the nursing notes. Pertinent labs &imaging results that were available during my care of the patient were reviewed by me and considered in my medical decision making (see chart for details).  After history, exam, and  medical workup I feel the patient has been appropriately medically screened and is safe for discharge home. Pertinent diagnoses were discussed with the patient. Patient was given return precautions.      Tashiya Souders, MD 10/31/17 [phone]

## 2017-10-30 NOTE — ED Triage Notes (Signed)
C/o "the beginning of bronchitis"-c/o nonprod cough x 3 hours-NAD-steady gait

## 2017-10-30 NOTE — ED Notes (Signed)
ED Provider at bedside. 

## 2017-10-31 LAB — TROPONIN I: Troponin I: <0.03 ng/mL (ref <0.03)

## 2017-10-31 MED ORDER — PREDNISONE 20 MG PO TABS: ORAL_TABLET | ORAL | 0 refills | Status: DC

## 2017-10-31 MED ORDER — BENZONATATE 100 MG PO CAPS: 100.0000 mg | ORAL_CAPSULE | Freq: Three times a day (TID) | ORAL | 0 refills | Status: DC

## 2017-10-31 MED ORDER — ALBUTEROL SULFATE HFA 108 (90 BASE) MCG/ACT IN AERS: 1.0000 | INHALATION_SPRAY | Freq: Four times a day (QID) | RESPIRATORY_TRACT | 0 refills | Status: AC | PRN

## 2017-11-02 ENCOUNTER — Encounter (HOSPITAL_BASED_OUTPATIENT_CLINIC_OR_DEPARTMENT_OTHER): Payer: Self-pay | Admitting: Emergency Medicine

## 2017-11-02 ENCOUNTER — Emergency Department (HOSPITAL_BASED_OUTPATIENT_CLINIC_OR_DEPARTMENT_OTHER)
Admission: EM | Admit: 2017-11-02 | Discharge: 2017-11-03 | Disposition: A | Discharge status: Home / Self Care | Payer: Commercial Managed Care - PPO | Attending: Emergency Medicine | Admitting: Emergency Medicine

## 2017-11-02 ENCOUNTER — Other Ambulatory Visit: Payer: Self-pay

## 2017-11-02 ENCOUNTER — Emergency Department (HOSPITAL_BASED_OUTPATIENT_CLINIC_OR_DEPARTMENT_OTHER): Payer: Commercial Managed Care - PPO

## 2017-11-02 DIAGNOSIS — J441 Chronic obstructive pulmonary disease with (acute) exacerbation: Secondary | ICD-10-CM | POA: Insufficient documentation

## 2017-11-02 DIAGNOSIS — F17210 Nicotine dependence, cigarettes, uncomplicated: Secondary | ICD-10-CM | POA: Diagnosis not present

## 2017-11-02 DIAGNOSIS — Z7984 Long term (current) use of oral hypoglycemic drugs: Secondary | ICD-10-CM | POA: Insufficient documentation

## 2017-11-02 DIAGNOSIS — Z79899 Other long term (current) drug therapy: Secondary | ICD-10-CM | POA: Insufficient documentation

## 2017-11-02 DIAGNOSIS — R000 Tachycardia, unspecified: Secondary | ICD-10-CM | POA: Insufficient documentation

## 2017-11-02 DIAGNOSIS — R0602 Shortness of breath: Secondary | ICD-10-CM | POA: Diagnosis present

## 2017-11-02 LAB — COMPREHENSIVE METABOLIC PANEL
ALK PHOS: 48 U/L (ref 38–126)
ALT: 13 U/L (ref 0–44)
ANION GAP: 18 — AB (ref 5–15)
AST: 32 U/L (ref 15–41)
Albumin: 4.8 g/dL (ref 3.5–5.0)
BILIRUBIN TOTAL: 0.6 mg/dL (ref 0.3–1.2)
BUN: 15 mg/dL (ref 6–20)
CALCIUM: 10.6 mg/dL — AB (ref 8.9–10.3)
CO2: 21 mmol/L — AB (ref 22–32)
CREATININE: 0.83 mg/dL (ref 0.61–1.24)
Chloride: 99 mmol/L (ref 98–111)
GFR calc non Af Amer: >60 mL/min (ref >60)
Glucose, Bld: 103 mg/dL — ABNORMAL HIGH (ref 70–99)
Potassium: 3.5 mmol/L (ref 3.5–5.1)
SODIUM: 138 mmol/L (ref 135–145)
TOTAL PROTEIN: 7.7 g/dL (ref 6.5–8.1)

## 2017-11-02 LAB — CBC WITH DIFFERENTIAL/PLATELET
Basophils Absolute: 0 10*3/uL (ref 0.0–0.1)
Basophils Relative: 0 %
EOS ABS: 0 10*3/uL (ref 0.0–0.7)
Eosinophils Relative: 0 %
HEMATOCRIT: 41.8 % (ref 39.0–52.0)
HEMOGLOBIN: 14.8 g/dL (ref 13.0–17.0)
LYMPHS ABS: 1.6 10*3/uL (ref 0.7–4.0)
LYMPHS PCT: 16 %
MCH: 31.6 pg (ref 26.0–34.0)
MCHC: 35.4 g/dL (ref 30.0–36.0)
MCV: 89.1 fL (ref 78.0–100.0)
MONOS PCT: 5 %
Monocytes Absolute: 0.5 10*3/uL (ref 0.1–1.0)
NEUTROS ABS: 7.7 10*3/uL (ref 1.7–7.7)
NEUTROS PCT: 79 %
Platelets: 179 10*3/uL (ref 150–400)
RBC: 4.69 MIL/uL (ref 4.22–5.81)
RDW: 12.7 % (ref 11.5–15.5)
WBC: 9.7 10*3/uL (ref 4.0–10.5)

## 2017-11-02 LAB — I-STAT CG4 LACTIC ACID, ED
Lactic Acid, Venous: 5.19 mmol/L (ref 0.5–1.9)
Lactic Acid, Venous: 1.31 mmol/L (ref 0.5–1.9)

## 2017-11-02 MED ORDER — SODIUM CHLORIDE 0.9 % IV BOLUS
2000.0000 mL | Freq: Once | INTRAVENOUS | Status: AC
  Administered 2017-11-02: 2000 mL via INTRAVENOUS

## 2017-11-02 MED ORDER — DOXYCYCLINE HYCLATE 100 MG PO CAPS: 100.0000 mg | ORAL_CAPSULE | Freq: Two times a day (BID) | ORAL | 0 refills | Status: AC

## 2017-11-02 MED ORDER — CHLORPHENIRAMINE MALEATE 2 MG/5ML PO SYRP: 4.0000 mg | ORAL_SOLUTION | Freq: Four times a day (QID) | ORAL | 0 refills | Status: DC | PRN

## 2017-11-02 MED ORDER — PREDNISONE 10 MG PO TABS: 60.0000 mg | ORAL_TABLET | Freq: Every day | ORAL | 0 refills | Status: AC

## 2017-11-02 MED ORDER — IPRATROPIUM BROMIDE 0.02 % IN SOLN: 0.5000 mg | Freq: Four times a day (QID) | RESPIRATORY_TRACT | 12 refills | Status: AC | PRN

## 2017-11-02 MED ORDER — ALBUTEROL SULFATE (2.5 MG/3ML) 0.083% IN NEBU: 2.5000 mg | INHALATION_SOLUTION | Freq: Four times a day (QID) | RESPIRATORY_TRACT | 6 refills | Status: AC | PRN

## 2017-11-02 MED ORDER — ALBUTEROL SULFATE (2.5 MG/3ML) 0.083% IN NEBU
5.0000 mg | INHALATION_SOLUTION | Freq: Once | RESPIRATORY_TRACT | Status: AC
  Administered 2017-11-02: 5 mg via RESPIRATORY_TRACT
  Filled 2017-11-02: qty 6

## 2017-11-02 MED ORDER — IOPAMIDOL (ISOVUE-370) INJECTION 76%
100.0000 mL | Freq: Once | INTRAVENOUS | Status: AC | PRN
  Administered 2017-11-02: 100 mL via INTRAVENOUS

## 2017-11-02 MED ORDER — DOXYCYCLINE HYCLATE 100 MG PO TABS
100.0000 mg | ORAL_TABLET | Freq: Once | ORAL | Status: AC
  Administered 2017-11-02: 100 mg via ORAL
  Filled 2017-11-02: qty 1

## 2017-11-02 MED ORDER — METHYLPREDNISOLONE SODIUM SUCC 125 MG IJ SOLR
125.0000 mg | Freq: Once | INTRAMUSCULAR | Status: AC
  Administered 2017-11-02: 125 mg via INTRAVENOUS
  Filled 2017-11-02: qty 2

## 2017-11-02 NOTE — ED Triage Notes (Signed)
Pt states he was seen here 10/30/17 and dx with bronchitis but meds have not helped and "it has progressed". Pt states he is SHOB despite neb tx PTA. HR 170 on arrival, and RR 40. Oxygen sat 100%. Pt appears very anxious.

## 2017-11-02 NOTE — ED Provider Notes (Signed)
[facility] EMERGENCY DEPARTMENT Provider Note   CSN: [id number] Arrival date & time: 11/02/17  1923     History   Chief Complaint Chief Complaint  Patient presents with  . Shortness of Breath    HPI Dustin Steele is a 42 y.o. male.  HPI Patient is a 42 year old male with a history of COPD.  He reports ongoing productive cough and shortness of breath despite treatment with steroids.  He is tried his nebulizer at home without significant improvement.  On arrival to the emergency department his heart rates 140.  He does seem anxious.  He states he feels short of breath.  He is tachypneic.  Chills at home without documented fever.  He follows with [facility].  He is continued on his Symbicort through all of this.  He is currently not on antibiotics.  He believes the prednisone dose that was recently prescribed to him is too low dose as he states he normally tolerates 60 mg of prednisone and this helps some better.   Past Medical History:  Diagnosis Date  . ADHD   . Anxiety   . 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 COPD (chronic obstructive pulmonary disease) (HCC)   . Nicotine dependence   . Opiate dependence (HCC)   . Panic disorder     Patient Active Problem List   Diagnosis Date Noted  . Chronic cough 06/09/2017  . GAD (generalized anxiety disorder) 01/21/2017  . MDD (major depressive disorder) 01/19/2017  . Cigarette smoker 01/17/2017  . Abnormal PFT 09/23/2016  . COPD GOLD 0/ ? AB still smoking    . Nicotine dependence   . Anxiety   . ADHD   . Panic disorder     Past Surgical History:  Procedure Laterality Date  . FEMUR FRACTURE SURGERY    . FEMUR IM ROD REMOVAL          Home Medications    Prior to Admission medications   Medication Sig Start Date End Date Taking? Authorizing Provider  albuterol (PROVENTIL HFA;VENTOLIN HFA) 108 (90 Base) MCG/ACT inhaler Inhale 2 puffs into the lungs every 6 (six) hours as needed for wheezing or shortness of  breath.   Yes [provider]  albuterol (PROVENTIL HFA;VENTOLIN HFA) 108 (90 Base) MCG/ACT inhaler Inhale 1-2 puffs into the lungs every 6 (six) hours as needed for wheezing or shortness of breath. 10/31/17  Yes Palumbo, April, MD  albuterol (PROVENTIL) (2.5 MG/3ML) 0.083% nebulizer solution Take 3 mLs (2.5 mg total) by nebulization every 6 (six) hours as needed for wheezing or shortness of breath. 10/17/16  Yes Zadie Rhine, MD  Armodafinil (NUVIGIL) 250 MG tablet Take 250 mg by mouth daily.   Yes [provider]  benzonatate (TESSALON) 100 MG capsule Take 1 capsule (100 mg total) by mouth every 8 (eight) hours. 10/31/17  Yes Palumbo, April, MD  budesonide-formoterol [facility]) 160-4.5 MCG/ACT inhaler Inhale 2 puffs into the lungs 2 (two) times daily. 04/09/17  Yes Nyoka Cowden, MD  clonazePAM (KLONOPIN) 0.5 MG tablet Take 1 tablet (0.5 mg total) by mouth 3 (three) times daily. 01/24/17  Yes Money, Gerlene Burdock, FNP  [facility] B-12 500 MCG tablet Take 500 mcg by mouth daily. 07/26/17  Yes [provider]  metFORMIN (GLUCOPHAGE) 500 MG tablet Take 1,000 mg by mouth daily with supper. 08/13/17  Yes [provider]  venlafaxine XR (EFFEXOR-XR) 150 MG 24 hr capsule Take 1 capsule (150 mg total) by mouth daily. For mood control Patient  taking differently: Take 300 mg by mouth daily. For mood control 01/25/17  Yes Money, Gerlene Burdock, FNP  venlafaxine XR (EFFEXOR-XR) 75 MG 24 hr capsule Take 75 mg by mouth daily at 2 PM.   Yes [provider]  VRAYLAR capsule Take 1.5 mg by mouth at bedtime. 07/28/17  Yes [provider]  albuterol (PROVENTIL) (2.5 MG/3ML) 0.083% nebulizer solution Take 3 mLs (2.5 mg total) by nebulization every 6 (six) hours as needed for wheezing or shortness of breath. 11/02/17   Azalia Bilis, MD  Buprenorphine HCl-Naloxone HCl (SUBOXONE SL) Place 1 Film under the tongue daily.    [provider]  chlorpheniramine (CHLOR-TRIMETON) 2 MG/5ML  syrup Take 10 mLs (4 mg total) by mouth every 6 (six) hours as needed (cough). 11/02/17   Azalia Bilis, MD  doxycycline (VIBRAMYCIN) 100 MG capsule Take 1 capsule (100 mg total) by mouth 2 (two) times daily for 10 days. 11/02/17 11/12/17  Azalia Bilis, MD  ipratropium (ATROVENT) 0.02 % nebulizer solution Take 2.5 mLs (0.5 mg total) by nebulization every 6 (six) hours as needed for wheezing or shortness of breath. 11/02/17   Azalia Bilis, MD  predniSONE (DELTASONE) 10 MG tablet Take 6 tablets (60 mg total) by mouth daily for 5 days. 11/02/17 11/07/17  Azalia Bilis, MD    Family History No family history on file.  Social History Social History   Tobacco Use  . Smoking status: Current Every Day Smoker    Packs/day: 0.75    Years: 20.00    Pack years: 15.00    Types: Cigarettes  . Smokeless tobacco: Never Used  Substance Use Topics  . Alcohol use: Not Currently  . Drug use: Not Currently    Types: Marijuana     Allergies   Wellbutrin [bupropion]   Review of Systems Review of Systems  All other systems reviewed and are negative.    Physical Exam Updated Vital Signs BP 127/74   Pulse 82   Temp 98 F (36.7 C) (Oral)   Resp 19   Ht 5\' 5"  (1.651 m)   Wt 62.1 kg   SpO2 100%   BMI 22.80 kg/m   Physical Exam  Constitutional: He is oriented to person, place, and time. He appears well-developed and well-nourished.  HENT:  Head: Normocephalic and atraumatic.  Eyes: EOM are normal.  Neck: Normal range of motion.  Cardiovascular: Regular rhythm, normal heart sounds and intact distal pulses.  Tachycardia  Pulmonary/Chest: Breath sounds normal. Accessory muscle usage present. Tachypnea noted. No respiratory distress.  Abdominal: Soft. He exhibits no distension. There is no tenderness.  Musculoskeletal: Normal range of motion.  Neurological: He is alert and oriented to person, place, and time.  Skin: Skin is warm and dry.  Psychiatric: He has a normal mood and affect. Judgment  normal.  Nursing note and vitals reviewed.    ED Treatments / Results  Labs (all labs ordered are listed, but only abnormal results are displayed) Labs Reviewed  COMPREHENSIVE METABOLIC PANEL - Abnormal; Notable for the following components:      Result Value   CO2 21 (*)    Glucose, Bld 103 (*)    Calcium 10.6 (*)    Anion gap 18 (*)    All other components within normal limits  I-STAT CG4 LACTIC ACID, ED - Abnormal; Notable for the following components:   Lactic Acid, Venous 5.19 (*)    All other components within normal limits  CULTURE, BLOOD (ROUTINE X 2)  CULTURE, BLOOD (  ROUTINE X 2)  CBC WITH DIFFERENTIAL/PLATELET  I-STAT CG4 LACTIC ACID, ED  I-STAT CG4 LACTIC ACID, ED    EKG EKG Interpretation  Date/Time:  Sunday November 02 2017 19:27:04 EDT Ventricular Rate:  123 PR Interval:  132 QRS Duration: 106 QT Interval:  392 QTC Calculation: 561 R Axis:   89 Text Interpretation:  Sinus tachycardia Biatrial enlargement Pulmonary disease pattern Left ventricular hypertrophy Nonspecific ST and T wave abnormality Abnormal ECG No significant change was found Confirmed by Azalia Bilisampos, Yerania Chamorro ([id number]) on 11/02/2017 11:44:42 PM   Radiology Ct Angio Chest Pe W And/or Wo Contrast  Result Date: 11/02/2017 CLINICAL DATA:  Shortness of breath 5 days. EXAM: CT ANGIOGRAPHY CHEST WITH CONTRAST TECHNIQUE: Multidetector CT imaging of the chest was performed using the standard protocol during bolus administration of intravenous contrast. Multiplanar CT image reconstructions and MIPs were obtained to evaluate the vascular anatomy. CONTRAST:  100mL ISOVUE-370 IOPAMIDOL (ISOVUE-370) INJECTION 76% COMPARISON:  None. FINDINGS: Cardiovascular: Heart is normal size. No evidence of pulmonary embolism. Thoracic aorta is normal. Mediastinum/Nodes: No mediastinal or hilar adenopathy. Lungs/Pleura: Lungs are adequately inflated without airspace consolidation or effusion. Airways are normal. Upper Abdomen: No  acute findings. Musculoskeletal: Unremarkable. Review of the MIP images confirms the above findings. IMPRESSION: No acute cardiopulmonary disease and no evidence of pulmonary embolism. Electronically Signed   By: Elberta Fortisaniel  Boyle M.D.   On: 11/02/2017 22:09   Dg Chest Portable 1 View  Result Date: 11/02/2017 CLINICAL DATA:  Chest pain and shortness-of-breath. EXAM: PORTABLE CHEST 1 VIEW COMPARISON:  10/30/2017 FINDINGS: Lungs are adequately inflated without focal airspace consolidation or effusion. Cardiomediastinal silhouette and remainder of the exam is unchanged. IMPRESSION: No active disease. Electronically Signed   By: Elberta Fortisaniel  Boyle M.D.   On: 11/02/2017 20:44    Procedures Procedures (including critical care time)  Medications Ordered in ED Medications  sodium chloride 0.9 % bolus 2,000 mL (2,000 mLs Intravenous New Bag/Given 11/02/17 2031)  albuterol (PROVENTIL) (2.5 MG/3ML) 0.083% nebulizer solution 5 mg (5 mg Nebulization Given 11/02/17 2042)  methylPREDNISolone sodium succinate (SOLU-MEDROL) 125 mg/2 mL injection 125 mg (125 mg Intravenous Given 11/02/17 2201)  iopamidol (ISOVUE-370) 76 % injection 100 mL (100 mLs Intravenous Contrast Given 11/02/17 2136)  doxycycline (VIBRA-TABS) tablet 100 mg (100 mg Oral Given 11/02/17 2307)     Initial Impression / Assessment and Plan / ED Course  I have reviewed the triage vital signs and the nursing notes.  Pertinent labs & imaging results that were available during my care of the patient were reviewed by me and considered in my medical decision making (see chart for details).     Feels much better after bronchodilators.  Heart rate and lactate improved with IV fluids.  I suspect he is dehydrated from increased insensible fluid loss associated with his tachypnea.  Now that his breathing is significantly improved and his heart rate is improved he feels much better would like to go home.  I think this is reasonable given the patient's significant  improvement in his vital signs and clearance of his lactate.  He will be discharged home with doxycycline as well as prednisone and bronchodilators to be used to his nebulizer machine.  Close follow-up with his pulmonologist.  He is encouraged to return to the emergency department for new or worsening symptoms  Final Clinical Impressions(s) / ED Diagnoses   Final diagnoses:  COPD exacerbation [facility](HCC)    ED Discharge Orders         Ordered  doxycycline (VIBRAMYCIN) 100 MG capsule  2 times daily     11/02/17 2332    predniSONE (DELTASONE) 10 MG tablet  Daily     11/02/17 2332    albuterol (PROVENTIL) (2.5 MG/3ML) 0.083% nebulizer solution  Every 6 hours PRN     11/02/17 2332    ipratropium (ATROVENT) 0.02 % nebulizer solution  Every 6 hours PRN     11/02/17 2332    chlorpheniramine (CHLOR-TRIMETON) 2 MG/5ML syrup  Every 6 hours PRN     11/02/17 2332           Azalia Bilis, MD 11/02/17 2348

## 2017-11-02 NOTE — Progress Notes (Signed)
Patient's Lactic Acid results  5.19 mmol/L.  Dr Patria Maneampos notified of results.

## 2017-11-05 ENCOUNTER — Inpatient Hospital Stay: Payer: Self-pay | Admitting: Acute Care

## 2017-11-07 ENCOUNTER — Telehealth: Payer: Self-pay | Admitting: Internal Medicine

## 2017-11-07 ENCOUNTER — Inpatient Hospital Stay: Payer: Self-pay | Admitting: Nurse Practitioner

## 2017-11-07 NOTE — Telephone Encounter (Signed)
ATC patient-no answer and VM not set up.   I have put patient on MW schedule for Friday next week at 3:45pm.   Will need to try again later.

## 2017-11-07 NOTE — Telephone Encounter (Signed)
Left message informing patient his appointment had been made for 11/14/2017 at 3:45pm. Nothing further needed at this time.

## 2017-11-08 LAB — CULTURE, BLOOD (ROUTINE X 2)
CULTURE: NO GROWTH
Culture: NO GROWTH
SPECIAL REQUESTS: ADEQUATE
Special Requests: ADEQUATE

## 2017-11-14 ENCOUNTER — Inpatient Hospital Stay: Payer: Self-pay | Admitting: Internal Medicine

## 2018-01-26 ENCOUNTER — Other Ambulatory Visit: Payer: Self-pay | Admitting: Internal Medicine

## 2018-01-26 MED ORDER — BUDESONIDE-FORMOTEROL FUMARATE 160-4.5 MCG/ACT IN AERO: 2.0000 | INHALATION_SPRAY | Freq: Two times a day (BID) | RESPIRATORY_TRACT | 0 refills | Status: AC

## 2018-02-24 ENCOUNTER — Other Ambulatory Visit: Payer: Self-pay | Admitting: Internal Medicine

## 2018-03-03 ENCOUNTER — Other Ambulatory Visit: Payer: Self-pay | Admitting: Internal Medicine

## 2018-06-09 ENCOUNTER — Telehealth: Payer: Self-pay | Admitting: Internal Medicine

## 2018-06-09 NOTE — Telephone Encounter (Signed)
Called and spoke with [name] letting her know that MW said since it has been a year since pt has been seen, pt needs to come in to office with a mask on for further eval. Misty Stanley expressed understanding. OV has been scheduled for pt with MW tomorrow, 4/22 at 9:30 and pt has been told to wear a mask which they do have one.  Pt was provided our new office address. Nothing further needed.

## 2018-06-09 NOTE — Telephone Encounter (Signed)
Called and spoke with [name] who stated pt has been coughing x2 weeks now and is coughign up white milky mucus which he has been doing x2 weeks now. Per Misty Stanley, pt has been on prednisone which he finished yesterday, 4/20. Pt was also previously on doxycycline due to symptoms and finished that about two days ago.  Pt is doing neb treatments about three times daily. Pt has not used his rescue inhaler. Pt has tried sudafed to see if it would help with head congestion. Pt was prescribed guaifenesin with codeine which was prescribed by PCP and pt has finished that med as well.  Pt has had complaints of SOB with exertion and has some irritation in chest as well.  Pt denies any fever, body aches or chills. Pt has not done any recent travelling and has not been around anyone that has been sick.  Pt wants to know recs to help with symptoms. Dr. Sherene Sires, please advise recs for pt. Thanks!

## 2018-06-09 NOTE — Telephone Encounter (Signed)
Not seen in a year, needs to come in with mask for eval and to [facility] ER if worse in meantime

## 2018-06-10 ENCOUNTER — Telehealth: Payer: Self-pay | Admitting: Internal Medicine

## 2018-06-10 ENCOUNTER — Other Ambulatory Visit: Payer: Self-pay

## 2018-06-10 ENCOUNTER — Encounter: Payer: Self-pay | Admitting: Internal Medicine

## 2018-06-10 ENCOUNTER — Ambulatory Visit: Payer: Commercial Managed Care - PPO | Admitting: Internal Medicine

## 2018-06-10 VITALS — BP 130/70 | HR 142 | Ht 65.0 in | Wt 183.6 lb

## 2018-06-10 DIAGNOSIS — F17210 Nicotine dependence, cigarettes, uncomplicated: Secondary | ICD-10-CM

## 2018-06-10 DIAGNOSIS — R05 Cough: Secondary | ICD-10-CM

## 2018-06-10 DIAGNOSIS — J449 Chronic obstructive pulmonary disease, unspecified: Secondary | ICD-10-CM

## 2018-06-10 DIAGNOSIS — R053 Chronic cough: Secondary | ICD-10-CM

## 2018-06-10 MED ORDER — PREDNISONE 10 MG PO TABS: ORAL_TABLET | ORAL | 0 refills | Status: DC

## 2018-06-10 MED ORDER — ACETAMINOPHEN-CODEINE #3 300-30 MG PO TABS: 1.0000 | ORAL_TABLET | ORAL | 0 refills | Status: AC | PRN

## 2018-06-10 MED ORDER — ACETAMINOPHEN-CODEINE #3 300-30 MG PO TABS: 1.0000 | ORAL_TABLET | ORAL | 0 refills | Status: DC | PRN

## 2018-06-10 NOTE — Assessment & Plan Note (Signed)
Counseled re importance of smoking cessation but did not meet time criteria for separate billing      I had an extended discussion with the patient reviewing all relevant studies completed to date and  lasting 25 minutes of a 40  minute acute office  visit addressing    re  severe non-specific but potentially very serious refractory respiratory symptoms of uncertain and potentially multiple  Etiologies.  See device teaching which extended face to face time for this visit    Each maintenance medication was reviewed in detail including most importantly the difference between maintenance and prns and under what circumstances the prns are to be triggered using an action plan format that is not reflected in the computer generated alphabetically organized AVS.    Please see AVS for specific instructions unique to this office visit that I personally wrote and verbalized to the the pt in detail and then reviewed with pt  by my nurse highlighting any changes in therapy/plan of care  recommended at today's visit.

## 2018-06-10 NOTE — Assessment & Plan Note (Addendum)
Active smoker Spirometry 01/17/2017  FEV1 3.47 (96%)  Ratio 70  With mild / mod curvature and < 6 sec exp - Allergy profile 01/17/2017 >  Eos 0.0 /  IgE  131 with RAST pos dust > grass , ragweed, tree, dog  - Alpha one AT screening 01/17/2017   MM level 144  - 01/17/2017    try off dpi's and use symb 160 2bid - PFT's  06/09/2017  FEV1 3.81 (106 % ) ratio 74  p 1 % improvement from saba p symbicort 160  prior to study with DLCO  99 % corrects to 84 % for alv volume      - 06/10/2018  After extensive coaching inhaler device,  effectiveness =  90% with hfa   Despite what should be a typical trigger, his lungs are clear with PLM so no change in maint rx needed

## 2018-06-10 NOTE — Telephone Encounter (Signed)
Called pt's pharmacy and spoke with Adela Lank asking if they had received the Rx for pt. Per Adela Lank, they did receive the Rx that was sent in by [facility]. Nothing further needed.

## 2018-06-10 NOTE — Telephone Encounter (Signed)
Pt is calling for an update on Rx. Pharm [facility]. Cb is [phone].

## 2018-06-10 NOTE — Telephone Encounter (Signed)
I didn't know about the guaifenesin with codeine but it wasn't effective anyway and  needs to be stopped regardless of how much is left in bottle and change to my instructions only

## 2018-06-10 NOTE — Patient Instructions (Addendum)
For cough >  mucinex dm 1200 mg every 12 hours and supplement with tylenol #3 one every 4 hours as needed   Try prilosec otc 20mg   Take 30- 60 min before your first and last meals of the day  until cough is completely gone for at least a week without the need for cough suppression  GERD (REFLUX)  is an extremely common cause of respiratory symptoms just like yours , many times with no obvious heartburn at all.    It can be treated with medication, but also with lifestyle changes including elevation of the head of your bed (ideally with 6 -8inch blocks under the headboard of your bed),  Smoking cessation, avoidance of late meals, excessive alcohol, and avoid fatty foods, chocolate, peppermint, colas, red wine, and acidic juices such as orange juice.  NO MINT OR MENTHOL PRODUCTS SO NO COUGH DROPS  USE SUGARLESS CANDY INSTEAD (Jolley ranchers or Stover's or Life Savers) or even ice chips will also do - the key is to swallow to prevent all throat clearing. NO OIL BASED VITAMINS - use powdered substitutes.  Avoid fish oil when coughing.       Prednisone Take 4 for three days 3 for three days 2 for three days 1 for three days and stop   Plan A = Automatic = continue singulair/ symbicort 160 Take 2 puffs first thing in am and then another 2 puffs about 12 hours later.     Plan B = Backup Only use your albuterol inhaler as a rescue medication to be used if you can't catch your breath by resting or doing a relaxed purse lip breathing pattern.  - The less you use it, the better it will work when you need it. - Ok to use the inhaler up to 2 puffs  every 4 hours if you must but call for appointment if use goes up over your usual need - Don't leave home without it !!  (think of it like the spare tire for your car)   Plan C = Crisis - only use your albuterol/ipatropium nebulizer if you first try Plan B and it fails to help > ok to use the nebulizer up to every 4 hours but if start needing it regularly  call for immediate appointment   Plan D = Doctor - call me if B and C not adequate  Plan E = ER - go to ER or call 911 if all else fails     If not improved need to schedule a sinus CT next step  The key is to stop smoking completely before smoking completely stops you!  For smoking cessation classes call [phone]       Please schedule a follow up visit in 3 months but call sooner if needed  with all medications /inhalers/ solutions in hand so we can verify exactly what you are taking. This includes all medications from all doctors and over the counters

## 2018-06-10 NOTE — Progress Notes (Signed)
Subjective:     Patient ID: Dustin Steele, male   DOB: 05-14-1975, 43 y.o.   MRN: [id number]    Brief patient profile:  60 yowm active smoker from [place]  then @ age16 month hosp with "bronchitis" with 02 but no vent with no need for inhalers in middle school or HS and good ex tol (soccer ) and could run a  Mile in 6 : 10 sec but by mid 20s noted doe and need for maint rx since age 86 = spiriva/advair dpi's referred to pulmonary clinic 01/17/2017 by Chip Boer NP    History of Present Illness  01/17/2017 1st [facility] office visit/ Dustin Steele   Chief Complaint  Patient presents with  . Follow-up    consult for COPD exacerbations up to 3 times a year. 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  Work on inhaler technique:    Plan B = Backup Only use your albuterol as a rescue medication Plan C = Crisis - only use your albuterol nebulizer if you first try Plan B    04/09/2017  f/u ov/Dustin Steele re:  Still smoking   -  Thinking about starting propranolol for nerves per psych Chief Complaint  Patient presents with  . Follow-up    dry cough, SOB, chest discomort, sneezing, no post nasal drip,   Baseline Dyspnea:  No limits at baseline Cough: no Sleep: ok No need for any alb Then starting 04/06/17 scratch throat / nasal congestion > then dry hacking cough, gen ant cp with coughing only  wants cough syrup rec No change on symbicort 160 Take 2 puffs first thing in am and then another 2 puffs about 12 hours later.  Work on  inhaler technique: zpak / Prednisone 10 mg take  4 each am x 2 days,   2 each am x 2 days,  1 each am x 2 days and stop     06/09/2017  f/u ov/Dustin Steele re: copd GOLD 0  - still smoking / maint on symb 160 2bid  Chief Complaint  Patient presents with  . Follow-up    Doing okay nasal congestion , SOB with acivity   Dyspnea: really not limited by breathing from desired activities  And rarely does any real exercise  Cough: only with flares with uri's > only once in last 2 months Sleep: ok  SABA use:  Only with flares "always starts out as head cold"  (was not blowing symb out thru nose as rec rec Your lung function is normal -  The key is to stop smoking completely before smoking completely stops you!  Work on inhaler technique  schedule sinus CT> never had scan   PCP added singulair by [employer] fall 2019 ? Helped?    06/09/2018 PC Called and spoke with [name] who stated pt has been coughing x 2 weeks now and is coughign up white milky mucus Per Misty Stanley, pt has been on prednisone  which he finished yesterday, 4/20. Pt was also previously on doxycycline due to symptoms and finished that about two days ago. Pt is doing neb treatments about three times daily. Pt has not used his rescue inhaler. Pt has tried sudafed to see if it would help with head congestion. Pt was prescribed guaifenesin with codeine which was prescribed by PCP and pt has finished that med as well. Pt has had complaints of SOB with exertion and has some irritation in chest as well.   ER pm 06/09/18  Dx bronchitis  Cta chest [facility] 06/10/2018 nl  rx nebs/ dexamethasone/ tussionex / fluids/ no new prescriptions and referred to pulmonary clinic by  EDP      06/10/2018 acute extended ov/Dustin Steele re:  Aecopd x 1st week in march 2020 and  still smoking / maint on symbicort 160 and singulair  Chief Complaint  Patient presents with  . Acute Visit    went to ED last night, chronic bronchitis, wheezing, SOB  no purulent mucus / maybe a  tbsp this am thick white   Already on day of ov  used neb with ipatropium/alb for  wheeze and sob at rest  gen ant chest discomfort from coughing fits    No obvious patterns in day to day or daytime variability or assoc excess/ purulent sputum or mucus plugs or hemoptysis or cp or chest tightness, subjective wheeze or overt sinus or hb symptoms.    Also denies any obvious fluctuation of symptoms with weather or environmental changes or other aggravating or alleviating factors except as outlined above   No unusual exposure hx or h/o childhood pna/ asthma or knowledge of premature birth.  Current Allergies, Complete Past Medical History, Past Surgical History, Family History, and Social History were reviewed in [facility] electronic medical record.  ROS  The following are not active complaints unless bolded Hoarseness, sore throat, dysphagia, dental problems, itching, sneezing,  nasal congestion or discharge of excess mucus or purulent secretions, ear ache,   fever, chills, sweats, unintended wt loss or wt gain, classically pleuritic or exertional cp,  orthopnea pnd or arm/hand swelling  or leg swelling, presyncope, palpitations, abdominal pain, anorexia, nausea, vomiting, diarrhea  or change in bowel habits or change in bladder habits, change in stools or change in urine, dysuria, hematuria,  rash, arthralgias, visual complaints, headache, numbness, weakness or ataxia or problems with walking or coordination,  change in mood or  memory.        Current Meds -   - NOTE:   Unable to verify as accurately reflecting what pt takes     Medication Sig  .     . albuterol (PROVENTIL HFA;VENTOLIN HFA) 108 (90 Base) MCG/ACT inhaler Inhale 1-2 puffs into the lungs every 6 (six) hours as needed for wheezing or shortness of breath.  Marland Kitchen. albuterol (PROVENTIL) (2.5 MG/3ML) 0.083% nebulizer solution Take 3 mLs (2.5 mg total) by nebulization every 6 (six) hours as needed for wheezing or shortness of breath.  .  Armodafinil (NUVIGIL) 250 MG tablet Take 250 mg by mouth daily.  . budesonide-formoterol (SYMBICORT) 160-4.5 MCG/ACT inhaler Inhale 2 puffs into the lungs 2 (two) times daily.  . clonazePAM (KLONOPIN) 0.5 MG tablet Take 1 tablet (0.5 mg total) by mouth 3 (three) times daily.  . [employer] B-12 500 MCG tablet Take 500 mcg by mouth daily.  Marland Kitchen. ipratropium (ATROVENT) 0.02 % nebulizer solution Take 2.5 mLs (0.5 mg total) by nebulization every 6 (six) hours as needed for wheezing or  shortness of breath.  . metFORMIN (GLUCOPHAGE) 500 MG tablet Take 1,000 mg by mouth daily with supper.  .     . venlafaxine XR (EFFEXOR-XR) 150 MG 24 hr capsule Take 1 capsule (150 mg total) by mouth daily. For mood control (Patient taking differently: Take 300 mg by mouth daily. For mood control)  .     Marland Kitchen VRAYLAR capsule Take 1.5 mg by mouth at bedtime.  .     .     .    .    .    .                      Objective:   Physical Exam     amb mod  hoarse wm nad / marked pseudowheeze resolves with plm   06/10/2018       183  06/09/2017       138   04/09/2017      144   01/17/17 130 lb 3.2 oz (59.1 kg)  12/11/16 135 lb (61.2 kg)  10/17/16 147 lb (66.7 kg)    Vital signs reviewed - Note on arrival 02 sats  98% on RA        HEENT: poor dentition/ mild non-specific swelling of  turbinates bilaterally, and nl oropharynx. Nl external ear canals without cough reflex   NECK :  without JVD/Nodes/TM/ nl carotid upstrokes bilaterally   LUNGS: no acc muscle use,  Nl contour chest which is clear to A and P bilaterally without cough on insp or exp maneuvers   CV:  RRR  no s3 or murmur or increase in P2, and no edema   ABD:  soft and nontender with nl inspiratory excursion in the supine position. No bruits or organomegaly appreciated, bowel sounds nl  MS:  Nl gait/ ext warm without deformities, calf tenderness, cyanosis or clubbing No obvious joint restrictions   SKIN: warm and dry without lesions    NEURO:   alert, approp, nl sensorium with  no motor or cerebellar deficits apparent.          I personally reviewed images and agree with radiology impression as follows:   Chest CTa 06/10/2018 1. No acute pulmonary embolism. 2. No other acute thoracic findings.        Assessment:

## 2018-06-10 NOTE — Telephone Encounter (Signed)
Called pt's pharmacy and spoke with Adela Lank letting her know that MW said he did want pt to do the tylenol #3 as prescribed. Adela Lank expressed understanding. Called and spoke with pt letting him know this information and stated that the pharmacy should be getting med filled that was sent in by MW. Pt expressed understanding. Nothing further needed.

## 2018-06-10 NOTE — Assessment & Plan Note (Signed)
Pattern is typical of  Upper airway cough syndrome (previously labeled PNDS),  is so named because it's frequently impossible to sort out how much is  CR/sinusitis with freq throat clearing (which can be related to primary GERD)   vs  causing  secondary (" extra esophageal")  GERD from wide swings in gastric pressure that occur with throat clearing, often  promoting self use of mint and menthol lozenges that reduce the lower esophageal sphincter tone and exacerbate the problem further in a cyclical fashion.   These are the same pts (now being labeled as having "irritable larynx syndrome" by some cough centers) who not infrequently have a history of having failed to tolerate ace inhibitors,  dry powder inhalers or biphosphonates or report having atypical/extraesophageal reflux symptoms that don't respond to standard doses of PPI  and are easily confused as having aecopd or asthma flares by even experienced allergists/ pulmonologists (myself included).   Of the three most common causes of  Sub-acute / recurrent or chronic cough, only one (GERD)  can actually contribute to/ trigger  the other two (asthma and post nasal drip syndrome)  and perpetuate the cylce of cough.  While not intuitively obvious, many patients with chronic low grade reflux do not cough until there is a primary insult that disturbs the protective epithelial barrier and exposes sensitive nerve endings.   This is typically viral but can due to PNDS and  either may apply here.   >>>   The point is that once this occurs, it is difficult to eliminate the cycle  using anything but a maximally effective acid suppression regimen at least in the short run, accompanied by an appropriate diet to address non acid GERD and control / eliminate the cough itself for at least 3 days with tylenol #3 and if not better sinus ct is next step.

## 2018-06-10 NOTE — Telephone Encounter (Signed)
Called and spoke with Adela Lank from pt's pharmacy in regards to the Rx. Per Adela Lank, pt had guaifenesin with codeine was prescribed by PCP Dr. Cyndia Bent 06/02/2018. I stated to Adela Lank that pt stated he was prescribed guaifenesin with codeine by PCP but was now finished with med but Adela Lank stated if pt was finished with the med, he took too much of the med at a time due to the med supposed to have lasted enough for a 12-day supply.  Dr. Sherene Sires, please advise if you are still okay with pharmacy filling the tylenol#3 that you prescribed knowing this info in regards to the guaifenesin with codeine that was prescribed by pt's PCP 4/14 which he is finished with already. Thanks!

## 2018-12-05 IMAGING — CR DG CHEST 2V
2 series · 2 of 2 positions shown · non-contrast
Comparison: 04/06/2016

CLINICAL DATA: Chest pain with inspiration.  Chills and fatigue.

EXAM:
CHEST  2 VIEW

[w chest pa]
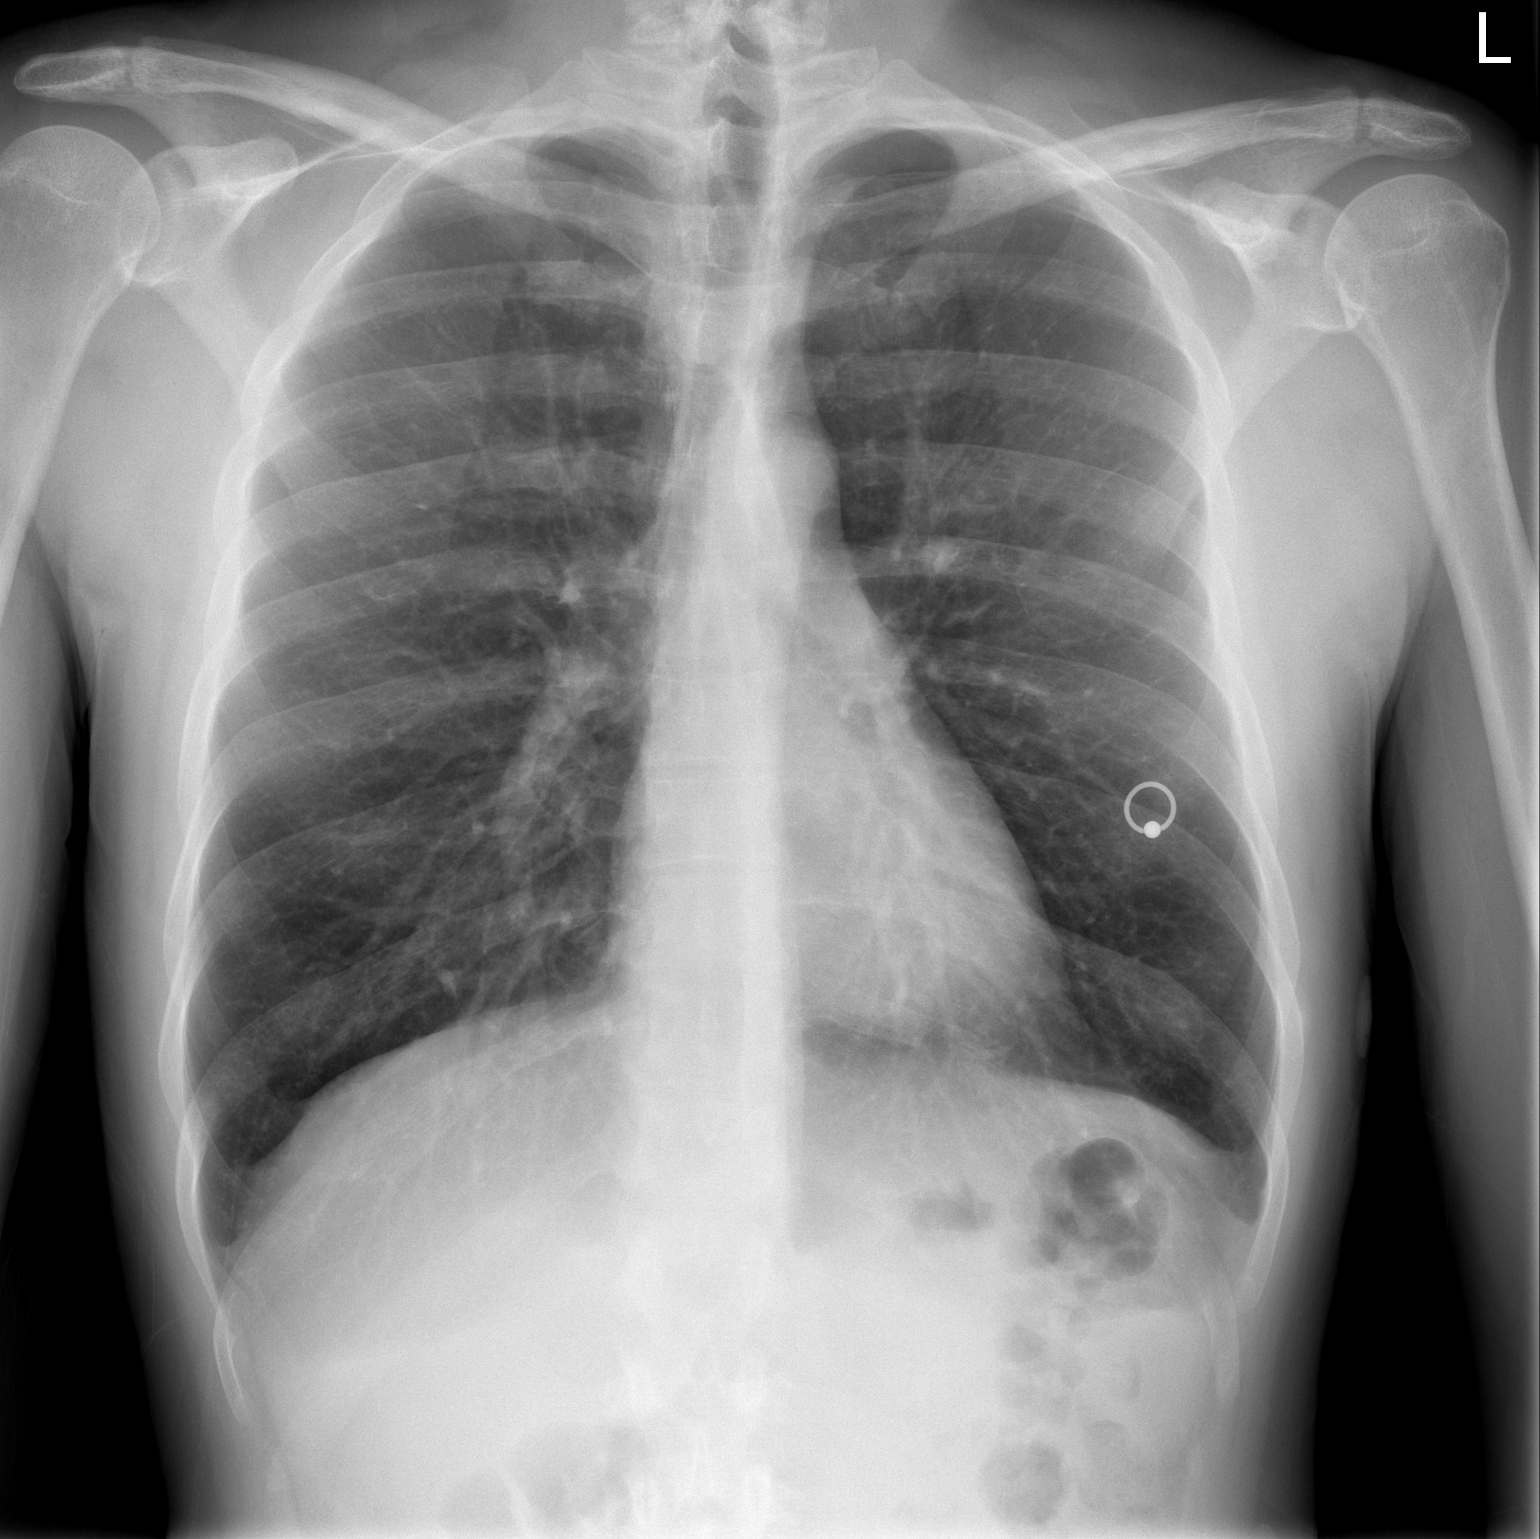

[w chest lat]
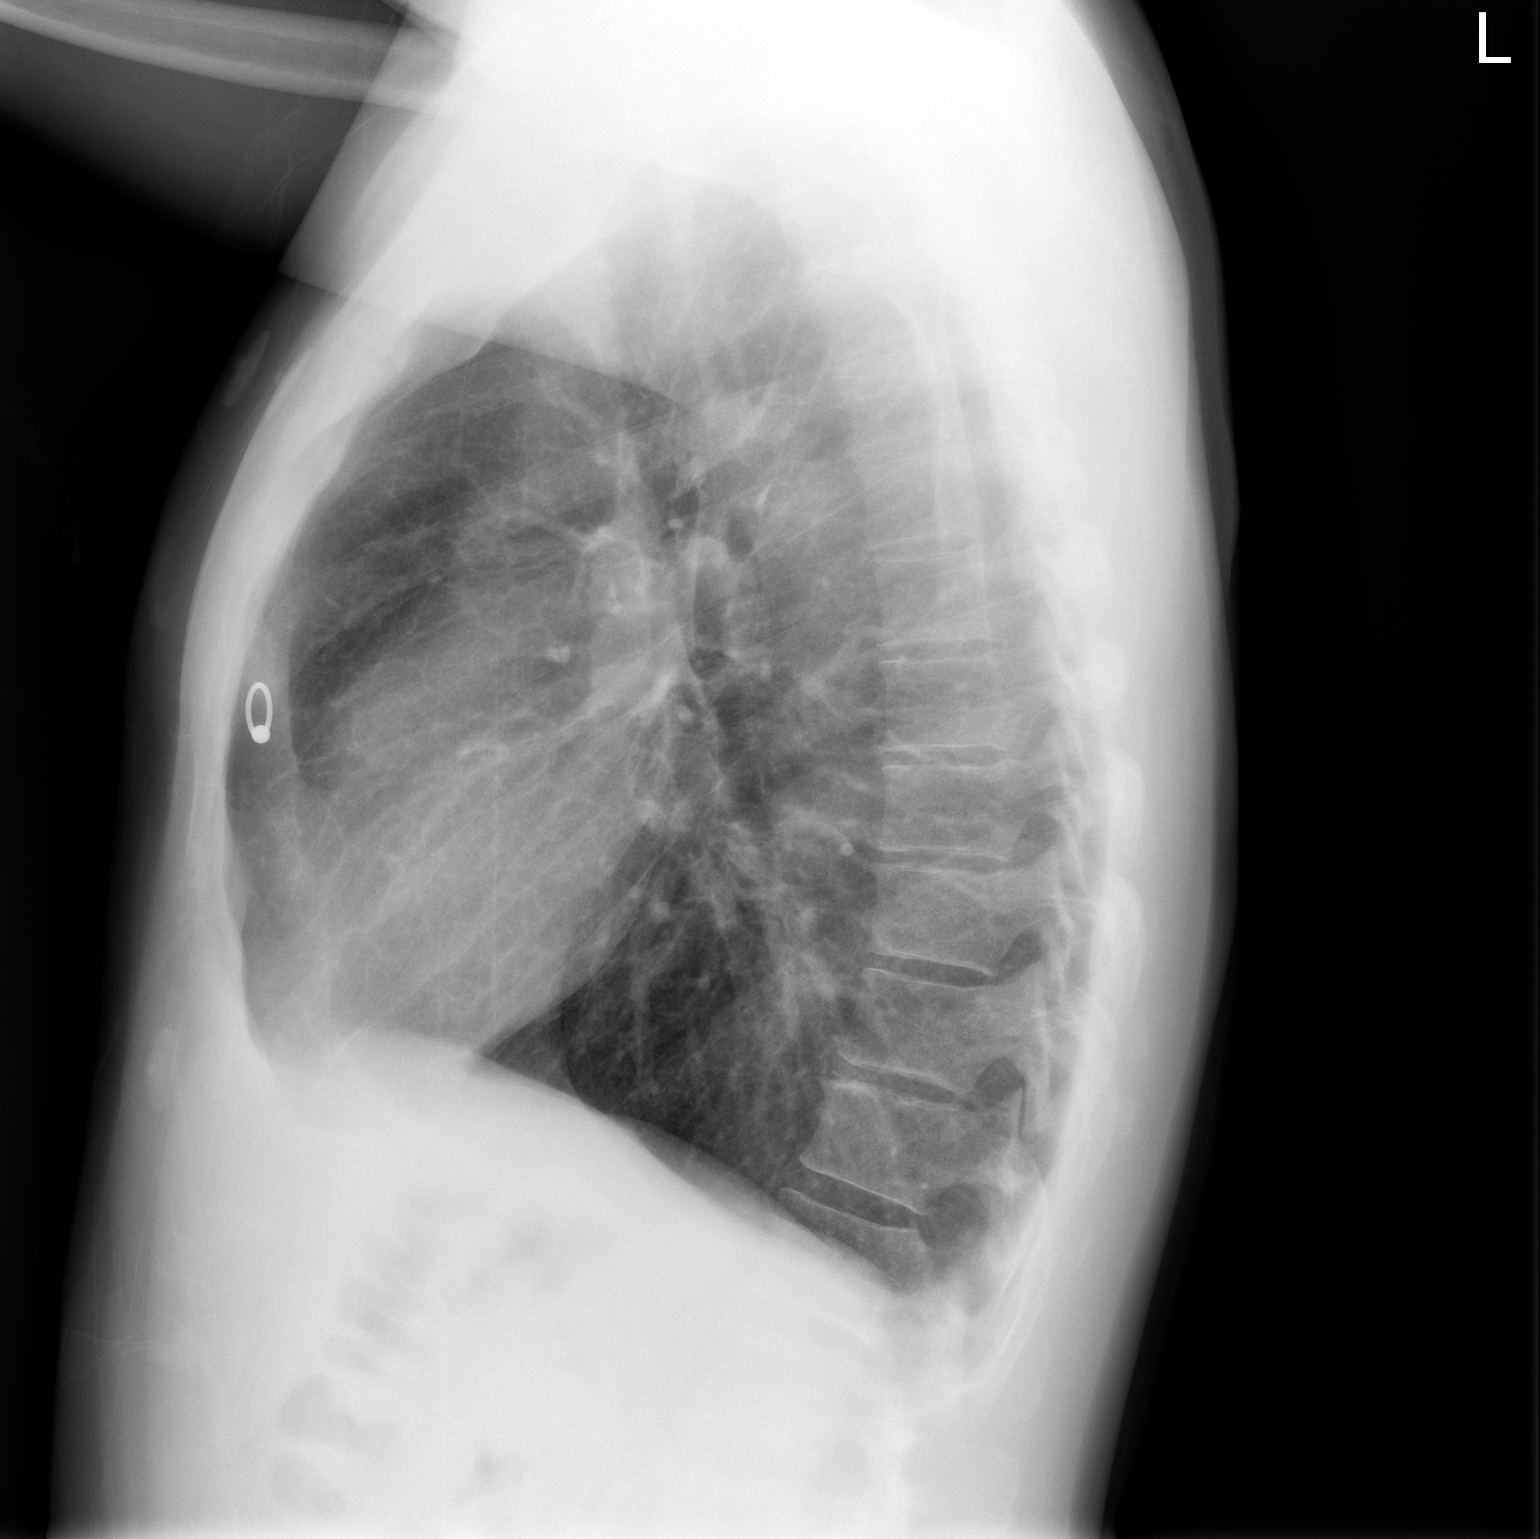

[2 of 2 positions shown; findings below may reference images not displayed]

FINDINGS: The heart size and mediastinal contours are within normal limits.
Both lungs are clear. The visualized skeletal structures are
unremarkable.
IMPRESSION: No active cardiopulmonary disease.

## 2019-04-27 IMAGING — CR DG CHEST 2V
2 series · 2 of 2 positions shown · non-contrast
Comparison: 07/21/2016

CLINICAL DATA: Cough for 2 weeks, shortness of Breath

EXAM:
CHEST  2 VIEW

[w chest pa]
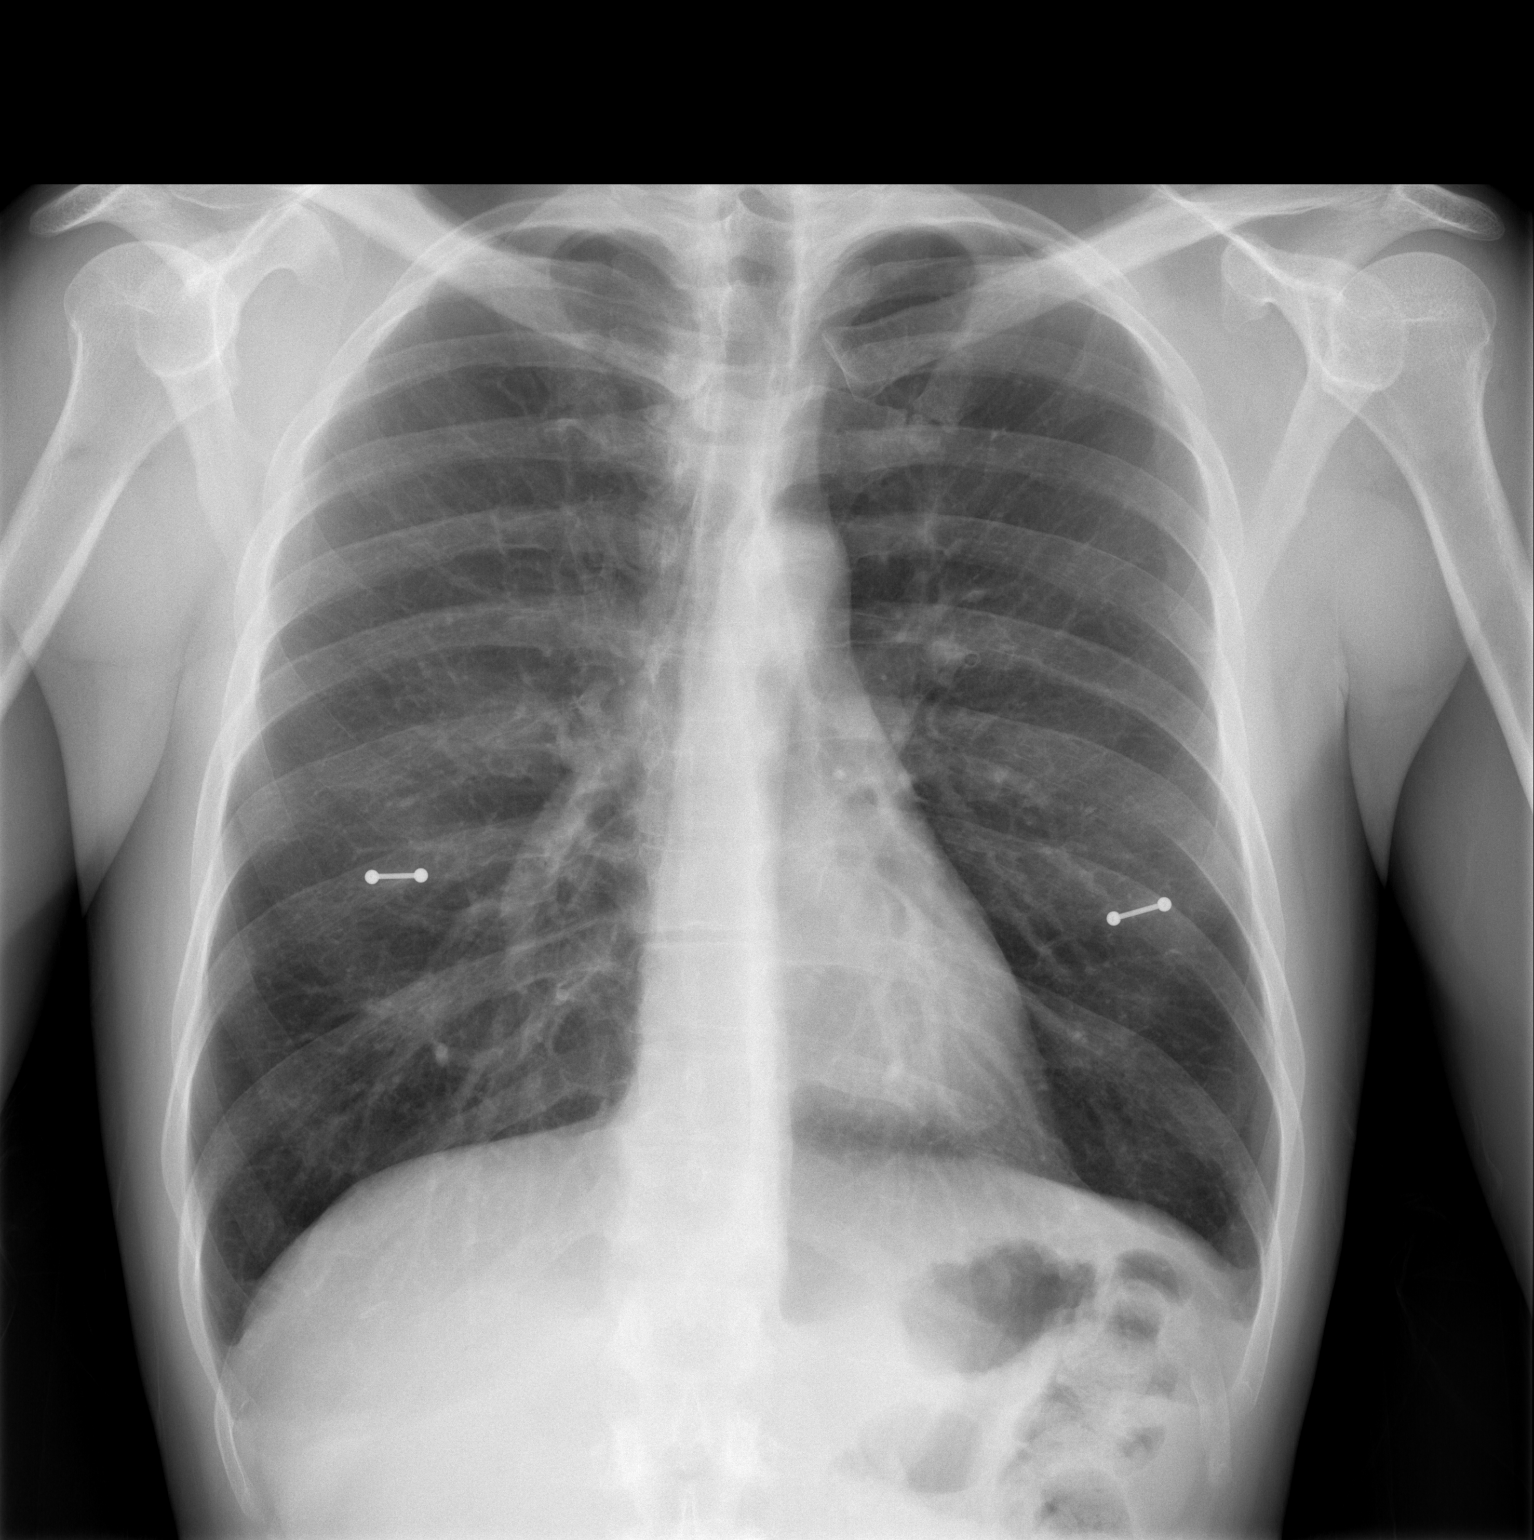

[w chest lat]
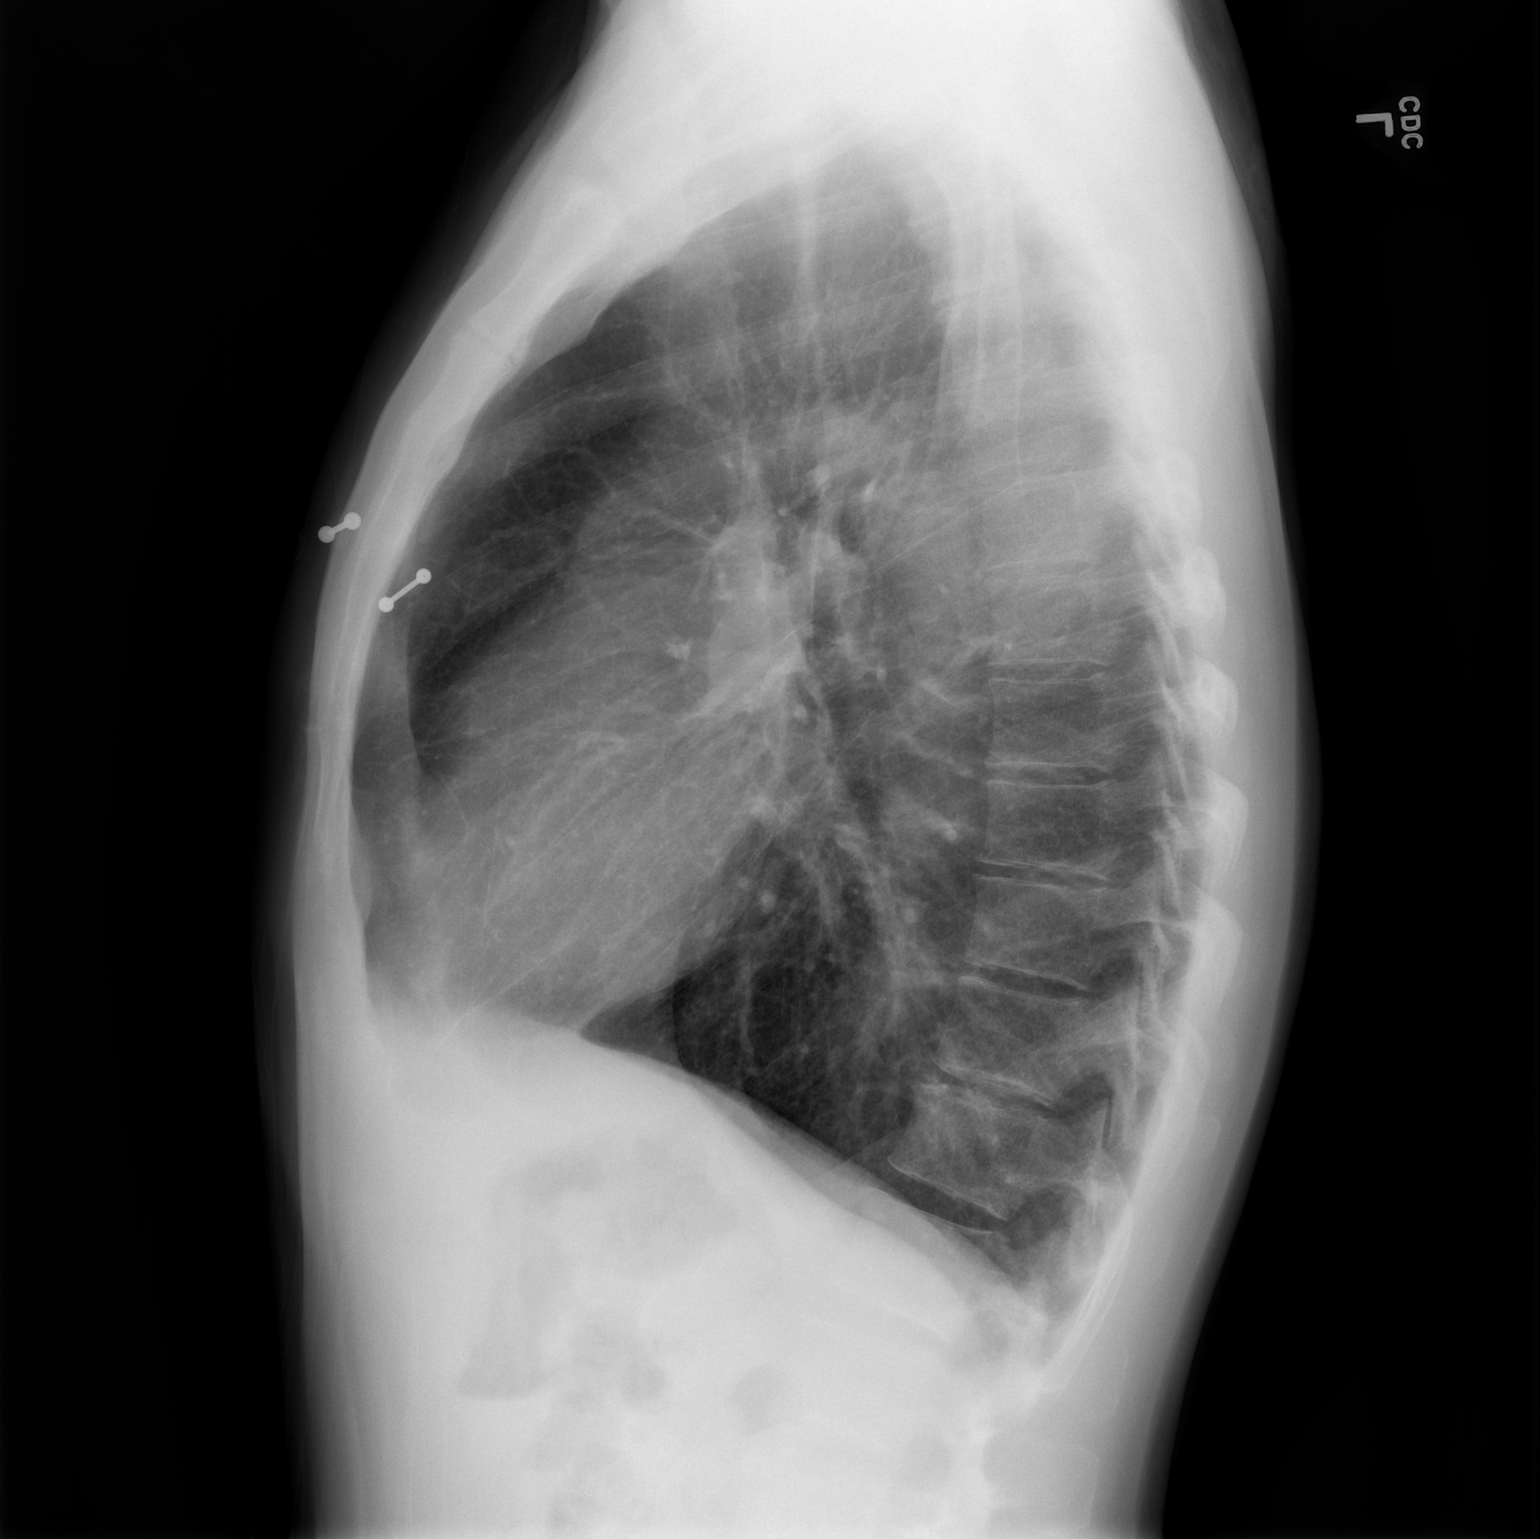

[2 of 2 positions shown; findings below may reference images not displayed]

FINDINGS: Heart and mediastinal contours are within normal limits. No focal
opacities or effusions. No acute bony abnormality.
IMPRESSION: No active cardiopulmonary disease.

## 2019-05-03 ENCOUNTER — Telehealth (INDEPENDENT_AMBULATORY_CARE_PROVIDER_SITE_OTHER): Payer: Self-pay | Admitting: Primary Care

## 2019-05-03 ENCOUNTER — Encounter: Payer: Self-pay | Admitting: Primary Care

## 2019-05-03 DIAGNOSIS — J209 Acute bronchitis, unspecified: Secondary | ICD-10-CM

## 2019-05-03 DIAGNOSIS — J440 Chronic obstructive pulmonary disease with acute lower respiratory infection: Secondary | ICD-10-CM

## 2019-05-03 MED ORDER — PREDNISONE 10 MG PO TABS: ORAL_TABLET | ORAL | 0 refills | Status: DC

## 2019-05-03 MED ORDER — DOXYCYCLINE HYCLATE 100 MG PO TABS: 100.0000 mg | ORAL_TABLET | Freq: Two times a day (BID) | ORAL | 0 refills | Status: DC

## 2019-05-03 NOTE — Patient Instructions (Addendum)
  Recommendations:  - Continue Symbicort two puffs twice daily; use albuterol rescue inhaler OR nebulizer every 4-6 hours for shortness of breath  - Continue Singulair, Zyrtec and Flonase nasal spray  - Take Mucinex 1,200 twice daily (with full glass of water) for 10 days - Resume Prilosec 20mg  twice daily before first and last meal for 2 weeks   Rx: -Extending doxycycline course additional 3 days -Extending prednisone taper   Smoking cessation: -When ready consider to quit smoking- please consider making an apt with our pharmacy team to discuss smoking cessation options

## 2019-05-03 NOTE — Progress Notes (Signed)
Virtual Visit via Video Note  I connected with Dustin Steele on 05/03/19 at 10:00 AM EDT by a video enabled telemedicine application and verified that I am speaking with the correct person using two identifiers.  Location: Patient: Home Provider: Office   I discussed the limitations of evaluation and management by telemedicine and the availability of in person appointments. The patient expressed understanding and agreed to proceed.  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 PMH significant for COPD GOLD 0, chronic cough, ADHD, anxiety, MDD. Patient of Dr. Sherene Sires, last seen on 06/10/18. Maintained on Symbicort 160, prn albuterol hfa/neb and Singulair. Eligible for covid vaccine this Wednesday.   05/03/2019 Patient contacted today for acute video visit. Still smoking 1 ppd. Reports nasal congestion, cough and increased sob x 1 week. Saw his primary care last week and was given 5 day course of prednisone and doxycycline. Cough is dry/irritating. He is compliant with Symbicort using it twice a day and using his albuterol nebulizer 2-3 times a day. He is taking Singulair, Zyrtec and flonase. Not currently taking mucinex or prilosec. Asking for something for cough. Patient can get covid vaccine next week, advised he wait until after done with prednisone taper.    Observations/Objective:  - Able to speak in full sentence - NO respiratory distress. Dry reactive cough   Pulmonary function testing: - Spirometry 01/17/2017  FEV1 3.47 (96%)  Ratio 70  With mild / mod curvature and < 6 sec  exp - PFT's  06/09/2017  FEV1 3.81 (106 % ) ratio 74  p 1 % improvement from saba p symbicort 160  prior to study with DLCO  99 % corrects to 84 % for alv volume    Labs: - Allergy profile 01/17/2017 >  Eos 0.0 /  IgE  131 with RAST pos dust > grass , ragweed, tree, dog  - Alpha one AT screening 01/17/2017   MM level 144   Assessment and Plan:  COPD/re-current bronchitis: - Extend prednisone taper and additional three days of doxycycline - Continue Symbicort 160 two puffs twice daily; albuterol hfa/nebulizer every 4-6 hours for shortness of breath  - Continue Singulair, Zyrtec and Flonase nasal spray  - Take Mucinex 1,200 twice daily (with full glass of water) for 10 days - Resume Prilosec 20mg  twice daily before first and last meal for 2 weeks  - Patient declined prescription for tessalon peres (holding off on tylenol #3 d/t possible medication interaction with Armodafinil and Spravato) - Ultimately needs to stop smoking  - Return if no improvement with above plan, consider CT sinuses    Follow Up Instructions:   - As needed if symptoms do not improve or worsen   I discussed the assessment and treatment plan with the patient. The patient was provided an opportunity to ask questions and all were answered. The patient agreed with the plan and demonstrated an understanding of the instructions.   The patient was advised to call back or seek an in-person evaluation if the  symptoms worsen or if the condition fails to improve as anticipated.  I provided 22 minutes of non-face-to-face time during this encounter.   Martyn Ehrich, NP

## 2020-03-17 ENCOUNTER — Telehealth: Payer: Self-pay | Admitting: Internal Medicine

## 2020-03-17 NOTE — Telephone Encounter (Signed)
No additional recs, agree with covid testing

## 2020-03-17 NOTE — Telephone Encounter (Signed)
Spoke with pt, states he is experiencing a COPD exacerbation- prod cough with yellow mucus, sob with exertion.  S/s present X5-6 days.  Denies fever, chills, nausea, vomiting, loss of taste/smell, GI upset. Pt has received 2 covid vaccines, no booster.  Pt saw PCP (through [facility]) for this,was given Prednisone 20mg  daily, Doxycycline, tussionex, and albuterol.   Pt is still smoking 1 ppd. Still takes Symbicort BID, using albuterol approx TID since feeling worse.   Pt has not been covid tested.  States he knows this is COPD and not covid.  I encouraged patient to get covid test to ensure this.    Routing to MW as and to see if he has any additional recs.  Thanks!

## 2020-03-17 NOTE — Telephone Encounter (Signed)
Pt aware of recs.  Nothing further needed at this time- will close encounter.

## 2021-03-06 ENCOUNTER — Other Ambulatory Visit: Payer: Self-pay

## 2021-03-06 ENCOUNTER — Encounter: Payer: Self-pay | Admitting: Internal Medicine

## 2021-03-06 ENCOUNTER — Ambulatory Visit: Payer: Managed Care, Other (non HMO) | Admitting: Internal Medicine

## 2021-03-06 DIAGNOSIS — F17210 Nicotine dependence, cigarettes, uncomplicated: Secondary | ICD-10-CM

## 2021-03-06 DIAGNOSIS — J449 Chronic obstructive pulmonary disease, unspecified: Secondary | ICD-10-CM

## 2021-03-06 MED ORDER — PREDNISONE 10 MG PO TABS: ORAL_TABLET | ORAL | 0 refills | Status: AC

## 2021-03-06 NOTE — Progress Notes (Signed)
Subjective:     Patient ID: Dustin Steele, male   DOB: 1975-11-04   MRN: [id number]    Brief patient profile:  89 yowm active smoker/MM  from [place]  then @ age16 month hosp with "bronchitis" with 02 but no vent with no need for inhalers in middle school or HS and good ex tol (soccer ) and could run a  [place]ile in 6 : 10 sec but by mid 55s noted doe and need for maint rx since age 46 = spiriva/advair dpi's referred to pulmonary clinic 01/17/2017 by Dustin Lamer NP    History of Present Illness  01/17/2017 1st [facility] office visit/ Dustin Steele   Chief Complaint  Patient presents with   Follow-up    consult for COPD exacerbations up to 3 times a year. 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  Work on inhaler technique:    Plan B = Backup Only use your albuterol as a rescue medication Plan C = Crisis - only use your albuterol nebulizer if you first try Plan B    04/09/2017  f/u ov/Dustin Steele re:  Still smoking   -  Thinking about starting propranolol for nerves per psych Chief Complaint  Patient presents with   Follow-up    dry cough, SOB, chest discomort, sneezing, no post nasal drip,   Baseline Dyspnea:  No limits at baseline Cough: no Sleep: ok No need for any alb Then starting 04/06/17 scratch throat / nasal congestion > then dry hacking cough, gen ant cp with coughing only  wants cough syrup rec No change on symbicort 160 Take 2 puffs first thing in am and then another 2 puffs about 12 hours later.  Work on inhaler  technique: zpak / Prednisone 10 mg take  4 each am x 2 days,   2 each am x 2 days,  1 each am x 2 days and stop     06/09/2017  f/u ov/Dustin Steele re: copd GOLD 0  - still smoking / maint on symb 160 2bid  Chief Complaint  Patient presents with   Follow-up    Doing okay nasal congestion , SOB with acivity   Dyspnea: really not limited by breathing from desired activities  And rarely does any real exercise  Cough: only with flares with uri's > only once in last 2 months Sleep: ok  SABA use:  Only with flares "always starts out as head cold"  (was not blowing symb out thru nose as rec rec Your lung function is normal -  The key is to stop smoking completely before smoking completely stops you!  Work on inhaler technique  schedule sinus CT> never had scan   PCP added singulair by [employer] fall 2019 ? Helped?    06/09/2018 PC Called and spoke with pt's wife Dustin Steele who stated pt has been coughing x 2 weeks now and is coughign up white milky mucus Per Dustin Steele, pt has been on prednisone which  he finished yesterday, 4/20. Pt was also previously on doxycycline due to symptoms and finished that about two days ago. Pt is doing neb treatments about three times daily. Pt has not used his rescue inhaler. Pt has tried sudafed to see if it would help with head congestion. Pt was prescribed guaifenesin with codeine which was prescribed by PCP and pt has finished that med as well. Pt has had complaints of SOB with exertion and has some irritation in chest as well.   ER pm 06/09/18  Dx bronchitis  Cta chest [facility] 06/10/2018 nl  rx nebs/ dexamethasone/ tussionex / fluids/ no new prescriptions and referred to pulmonary clinic by  EDP      06/10/2018 acute extended ov/Dustin Steele re:  Aecopd x 1st week in march 2020 and  still smoking / maint on symbicort 160 and singulair  Chief Complaint  Patient presents with   Acute Visit    went to ED last night, chronic bronchitis, wheezing, SOB  no purulent mucus / maybe a tbsp this  am thick white   Already on day of ov  used neb with ipatropium/alb for  wheeze and sob at rest  gen ant chest discomfort from coughing fits  Rec For cough >  mucinex dm 1200 mg every 12 hours and supplement with tylenol #3 one every 4 hours as needed  Try prilosec otc 20mg   Take 30- 60 min before your first and last meals of the day  until cough is completely gone for at least a week without the need for cough suppression GERD diet reviewed, bed blocks rec  Prednisone Take 4 for three days 3 for three days 2 for three days 1 for three days and stop  Plan A = Automatic = continue singulair/ symbicort 160 Take 2 puffs first thing in am and then another 2 puffs about 12 hours later.  Plan B = Backup Only use your albuterol inhaler as a rescue medication Plan C = Crisis - only use your albuterol/ipatropium nebulizer if you first try Plan B If not improved need to schedule a sinus CT next step> not done as of 03/06/2021 > per Dustin Steele  The key is to stop smoking completely before smoking completely stops you!   03/06/2021  acute extened  ov/Dustin Steele re: COPD GOLD 0   maint on symbicort 160  2bid / last doing well ny and caught  Chief Complaint  Patient presents with   Acute Visit    Increased SOB for the past 2 wks, worse past few days. He is coughing- non prod. He states "it started in my sinuses"- tx with   Dyspnea:  worse than usual x 6 days already seen by [facility] > levquin and ent eval rec Cough: attibutes to pnds yellow but clearing on levaquin though still coughing  Sleeping: worse since flare  SABA use: hfa and neb use but not at baseline  02: none Covid status:  vax x one    No obvious day to day or daytime variability or assoc ongoing excess/ purulent sputum or mucus plugs or hemoptysis or cp or chest tightness, subjective wheeze or overt   hb symptoms.     Also denies any obvious fluctuation of symptoms with weather or environmental changes or other aggravating or  alleviating factors except as outlined above   No unusual exposure hx or h/o childhood pna/ asthma or knowledge of premature birth.  Current Allergies, Complete Past Medical History, Past Surgical History, Family History, and Social History  were reviewed in [facility] record.  ROS  The following are not active complaints unless bolded Hoarseness, sore throat, dysphagia, dental problems, itching, sneezing,  nasal congestion or discharge of excess mucus or purulent secretions, ear ache,   fever, chills, sweats, unintended wt loss or wt gain, classically pleuritic or exertional cp,  orthopnea pnd or arm/hand swelling  or leg swelling, presyncope, palpitations, abdominal pain, anorexia, nausea, vomiting, diarrhea  or change in bowel habits or change in bladder habits, change in stools or change in urine, dysuria, hematuria,  rash, arthralgias, visual complaints, headache, numbness, weakness or ataxia or problems with walking or coordination,  change in mood or  memory.        Current Meds  Medication Sig   albuterol (PROVENTIL HFA;VENTOLIN HFA) 108 (90 Base) MCG/ACT inhaler Inhale 1-2 puffs into the lungs every 6 (six) hours as needed for wheezing or shortness of breath.   albuterol (PROVENTIL) (2.5 MG/3ML) 0.083% nebulizer solution Take 3 mLs (2.5 mg total) by nebulization every 6 (six) hours as needed for wheezing or shortness of breath.   Armodafinil 250 MG tablet Take 250 mg by mouth daily.   budesonide-formoterol (SYMBICORT) 160-4.5 MCG/ACT inhaler Inhale 2 puffs into the lungs 2 (two) times daily.   clonazePAM (KLONOPIN) 0.5 MG tablet Take 1 tablet (0.5 mg total) by mouth 3 (three) times daily.   [employer] B-12 500 MCG tablet Take 500 mcg by mouth daily.   fluticasone (FLONASE) 50 MCG/ACT nasal spray two sprays by Both Nostrils route daily.   ipratropium (ATROVENT) 0.02 % nebulizer solution Take 2.5 mLs (0.5 mg total) by nebulization every 6 (six) hours as needed for wheezing  or shortness of breath.   levofloxacin (LEVAQUIN) 500 MG tablet Take 1 tablet by mouth in the morning and at bedtime.   montelukast (SINGULAIR) 10 MG tablet Take 10 mg by mouth at bedtime.   venlafaxine XR (EFFEXOR-XR) 150 MG 24 hr capsule Take 1 capsule (150 mg total) by mouth daily. For mood control (Patient taking differently: Take 300 mg by mouth daily. For mood control)                       Objective:   Physical Exam  Wts  03/06/2021        146  06/10/2018       183  06/09/2017       138   04/09/2017      144   01/17/17 130 lb 3.2 oz (59.1 kg)  12/11/16 135 lb (61.2 kg)  10/17/16 147 lb (66.7 kg)      Vital signs reviewed  03/06/2021  - Note at rest 02 sats  100% on RA   General appearance:    tremulous amb wm nad   HEENT : pt wearing mask not removed for exam due to covid - 19 concerns.   NECK :  without JVD/Nodes/TM/ nl carotid upstrokes bilaterally   LUNGS: no acc muscle use,  Min barrel  contour chest wall with bilateral  slightly decreased bs s audible wheeze and  without cough on insp or exp maneuvers and min  Hyperresonant  to  percussion bilaterally     CV:  RRR  no s3 or murmur or increase in P2, and no edema   ABD:  soft and nontender with pos end  insp Hoover's  in the supine position. No bruits or organomegaly appreciated, bowel sounds nl  MS:   Nl gait/  ext warm  without deformities, calf tenderness, cyanosis or clubbing No obvious joint restrictions   SKIN: warm and dry without lesions    NEURO:  alert, approp, nl sensorium with  no motor or cerebellar deficits apparent.            Assessment:

## 2021-03-06 NOTE — Patient Instructions (Addendum)
Plan A = Automatic = Always=    Symbicort 160 Take 2 puffs first thing in am and then another 2 puffs about 12 hours later.     Plan B = Backup (to supplement plan A, not to replace it) Only use your albuterol inhaler as a rescue medication to be used if you can't catch your breath by resting or doing a relaxed purse lip breathing pattern.  - The less you use it, the better it will work when you need it. - Ok to use the inhaler up to 2 puffs  every 4 hours if you must but call for appointment if use goes up over your usual need - Don't leave home without it !!  (think of it like the spare tire for your car)    Plan C = Crisis (instead of Plan B but only if Plan B stops working) - only use your albuterol nebulizer if you first try Plan B and it fails to help > ok to use the nebulizer up to every 4 hours but if start needing it regularly call for immediate appointment    Prednisone 10 mg take  4 each am x 2 days,   2 each am x 2 days,  1 each am x 2 days and stop   If still having trouble breathing ok to use clonazepam up the the maximum dose  Keep appt to see your sinus doctor   Follow up is as needed

## 2021-03-07 ENCOUNTER — Encounter: Payer: Self-pay | Admitting: Internal Medicine

## 2021-03-07 NOTE — Assessment & Plan Note (Signed)
Counseled re importance of smoking cessation but did not meet time criteria for separate billing           Each maintenance medication was reviewed in detail including emphasizing most importantly the difference between maintenance and prns and under what circumstances the prns are to be triggered using an action plan format where appropriate.  Total time for H and P, chart review, counseling, reviewing hfa/neb device(s) and generating customized AVS unique to this acute  office visit for pt not seen in several years/ same day charting > 30 min

## 2021-03-07 NOTE — Assessment & Plan Note (Addendum)
Active smoker Spirometry 01/17/2017  FEV1 3.47 (96%)  Ratio 70  With mild / mod curvature and < 6 sec exp - Allergy profile 01/17/2017 >  Eos 0.0 /  IgE  131 with RAST pos dust > grass , ragweed, tree, dog  - Alpha one AT screening 01/17/2017   MM level 144  - 01/17/2017    try off dpi's and use symb 160 2bid - PFT's  06/09/2017  FEV1 3.81 (106 % ) ratio 74  p 1 % improvement from saba p symbicort 160  prior to study with DLCO  99 % corrects to 84 % for alv volume    - 06/10/2018  After extensive coaching inhaler device,  effectiveness =  90%  - 03/06/2021  After extensive coaching inhaler device,  effectiveness =   90%  He has more of an AB pattern than copd and may be triggered by sinus dz so rec continue symbicort 160 2bid and approp saba  Re SABA :  I spent extra time with pt today reviewing appropriate use of albuterol for prn use on exertion with the following points: 1) saba is for relief of sob that does not improve by walking a slower pace or resting but rather if the pt does not improve after trying this first. 2) If the pt is convinced, as many are, that saba helps recover from activity faster then it's easy to tell if this is the case by re-challenging : ie stop, take the inhaler, then p 5 minutes try the exact same activity (intensity of workload) that just caused the symptoms and see if they are substantially diminished or not after saba 3) if there is an activity that reproducibly causes the symptoms, try the saba 15 min before the activity on alternate days   If in fact the saba really does help, then fine to continue to use it prn but advised may need to look closer at the maintenance regimen being used to achieve better control of airways disease with exertion.   Note DDX of  difficult airways management almost all start with A and  include Adherence, Ace Inhibitors, Acid Reflux, Active Sinus Disease, Alpha 1 Antitripsin deficiency, Anxiety masquerading as Airways dz,  ABPA,   Allergy(esp in young), Aspiration (esp in elderly), Adverse effects of meds,  Active smoking or vaping, A bunch of PE's (a small clot burden can't cause this syndrome unless there is already severe underlying pulm or vascular dz with poor reserve) plus two Bs  = Bronchiectasis and Beta blocker use..and one C= CHF   Adherence is always the initial "prime suspect" and is a multilayered concern that requires a "trust but verify" approach in every patient - starting with knowing how to use medications, especially inhalers, correctly, keeping up with refills and understanding the fundamental difference between maintenance and prns vs those medications only taken for a very short course and then stopped and not refilled.  - see hfa teaching  Active smoking also at top of list > see sep a/p  ? Anxiety > usually at the bottom of this list of usual suspects but should be much higher on this pt's based on H and P and note already on psychotropics and may interfere with adherence and also interpretation of response or lack thereof to symptom management which can be quite subjective>  Ok to use clonazepam short term to control sob if pulmonary rx as outlined not effective and f/u with whoever is prescribing psychotropics to monitor going forward  ?  Allergy > Prednisone 10 mg take  4 each am x 2 days,   2 each am x 2 days,  1 each am x 2 days and stop   ? Active sinus dz > f/u with ent as rec and finish abx

## 2021-08-13 ENCOUNTER — Emergency Department (HOSPITAL_COMMUNITY)
Admission: EM | Admit: 2021-08-13 | Discharge: 2021-08-13 | Disposition: A | Discharge status: Home / Self Care | Payer: Managed Care, Other (non HMO) | Attending: Emergency Medicine | Admitting: Emergency Medicine

## 2021-08-13 DIAGNOSIS — E860 Dehydration: Secondary | ICD-10-CM | POA: Insufficient documentation

## 2021-08-13 DIAGNOSIS — Z7951 Long term (current) use of inhaled steroids: Secondary | ICD-10-CM | POA: Insufficient documentation

## 2021-08-13 DIAGNOSIS — J449 Chronic obstructive pulmonary disease, unspecified: Secondary | ICD-10-CM | POA: Diagnosis not present

## 2021-08-13 DIAGNOSIS — Z79899 Other long term (current) drug therapy: Secondary | ICD-10-CM | POA: Insufficient documentation

## 2021-08-13 LAB — URINALYSIS, ROUTINE W REFLEX MICROSCOPIC
Bilirubin Urine: NEGATIVE
Glucose, UA: NEGATIVE mg/dL
Hgb urine dipstick: NEGATIVE
Ketones, ur: NEGATIVE mg/dL
Leukocytes,Ua: NEGATIVE
Nitrite: NEGATIVE
Protein, ur: NEGATIVE mg/dL
Specific Gravity, Urine: 1.02 (ref 1.005–1.030)
pH: 7 (ref 5.0–8.0)

## 2021-08-13 LAB — CBC WITH DIFFERENTIAL/PLATELET
Abs Immature Granulocytes: 0.03 10*3/uL (ref 0.00–0.07)
Basophils Absolute: 0.1 10*3/uL (ref 0.0–0.1)
Basophils Relative: 1 %
Eosinophils Absolute: 0.2 10*3/uL (ref 0.0–0.5)
Eosinophils Relative: 3 %
HCT: 44.6 % (ref 39.0–52.0)
Hemoglobin: 14.9 g/dL (ref 13.0–17.0)
Immature Granulocytes: 0 %
Lymphocytes Relative: 17 %
Lymphs Abs: 1.2 10*3/uL (ref 0.7–4.0)
MCH: 30.2 pg (ref 26.0–34.0)
MCHC: 33.4 g/dL (ref 30.0–36.0)
MCV: 90.3 fL (ref 80.0–100.0)
Monocytes Absolute: 0.4 10*3/uL (ref 0.1–1.0)
Monocytes Relative: 6 %
Neutro Abs: 5.1 10*3/uL (ref 1.7–7.7)
Neutrophils Relative %: 73 %
Platelets: 234 10*3/uL (ref 150–400)
RBC: 4.94 MIL/uL (ref 4.22–5.81)
RDW: 14.5 % (ref 11.5–15.5)
WBC: 6.9 10*3/uL (ref 4.0–10.5)
nRBC: 0 % (ref 0.0–0.2)

## 2021-08-13 LAB — COMPREHENSIVE METABOLIC PANEL
ALT: 21 U/L (ref 0–44)
AST: 22 U/L (ref 15–41)
Albumin: 4.2 g/dL (ref 3.5–5.0)
Alkaline Phosphatase: 72 U/L (ref 38–126)
Anion gap: 8 (ref 5–15)
BUN: 31 mg/dL — ABNORMAL HIGH (ref 6–20)
CO2: 24 mmol/L (ref 22–32)
Calcium: 9.1 mg/dL (ref 8.9–10.3)
Chloride: 106 mmol/L (ref 98–111)
Creatinine, Ser: 0.62 mg/dL (ref 0.61–1.24)
GFR, Estimated: >60 mL/min (ref >60)
Glucose, Bld: 107 mg/dL — ABNORMAL HIGH (ref 70–99)
Potassium: 3.9 mmol/L (ref 3.5–5.1)
Sodium: 138 mmol/L (ref 135–145)
Total Bilirubin: 0.4 mg/dL (ref 0.3–1.2)
Total Protein: 7.5 g/dL (ref 6.5–8.1)

## 2021-08-13 LAB — LIPASE, BLOOD: Lipase: 25 U/L (ref 11–51)

## 2021-08-13 LAB — RAPID URINE DRUG SCREEN, HOSP PERFORMED
Amphetamines: NOT DETECTED
Barbiturates: NOT DETECTED
Benzodiazepines: POSITIVE — AB
Cocaine: NOT DETECTED
Opiates: NOT DETECTED
Tetrahydrocannabinol: POSITIVE — AB

## 2021-08-13 LAB — CK: Total CK: 176 U/L (ref 49–397)

## 2021-08-13 LAB — ETHANOL: Alcohol, Ethyl (B): <10 mg/dL (ref <10)

## 2021-08-13 MED ORDER — SODIUM CHLORIDE 0.9 % IV BOLUS
1000.0000 mL | Freq: Once | INTRAVENOUS | Status: AC
  Administered 2021-08-13: 1000 mL via INTRAVENOUS
